# Patient Record
Sex: Male | Born: 1950
Health system: Southern US, Community
[De-identification: ages and names within clinical notes are randomized; demographics above are authoritative.]

## PROBLEM LIST (undated history)

## (undated) DIAGNOSIS — Z8249 Family history of ischemic heart disease and other diseases of the circulatory system: Secondary | ICD-10-CM

## (undated) DIAGNOSIS — I10 Essential (primary) hypertension: Secondary | ICD-10-CM

## (undated) DIAGNOSIS — I209 Angina pectoris, unspecified: Secondary | ICD-10-CM

## (undated) DIAGNOSIS — K219 Gastro-esophageal reflux disease without esophagitis: Secondary | ICD-10-CM

## (undated) DIAGNOSIS — H179 Unspecified corneal scar and opacity: Secondary | ICD-10-CM

## (undated) DIAGNOSIS — E785 Hyperlipidemia, unspecified: Secondary | ICD-10-CM

## (undated) DIAGNOSIS — Z87442 Personal history of urinary calculi: Secondary | ICD-10-CM

## (undated) DIAGNOSIS — I639 Cerebral infarction, unspecified: Secondary | ICD-10-CM

## (undated) DIAGNOSIS — I2510 Atherosclerotic heart disease of native coronary artery without angina pectoris: Secondary | ICD-10-CM

## (undated) DIAGNOSIS — Z8719 Personal history of other diseases of the digestive system: Secondary | ICD-10-CM

## (undated) HISTORY — DX: Gastro-esophageal reflux disease without esophagitis: K21.9

## (undated) HISTORY — PX: CATARACT EXTRACTION: SUR2

## (undated) HISTORY — DX: Family history of ischemic heart disease and other diseases of the circulatory system: Z82.49

## (undated) HISTORY — PX: OTHER SURGICAL HISTORY: SHX169

## (undated) HISTORY — DX: Essential (primary) hypertension: I10

## (undated) HISTORY — DX: Hyperlipidemia, unspecified: E78.5

## (undated) HISTORY — DX: Unspecified corneal scar and opacity: H17.9

## (undated) HISTORY — DX: Cerebral infarction, unspecified: I63.9

---

## 2002-08-17 HISTORY — PX: COLONOSCOPY: SHX174

## 2006-08-18 ENCOUNTER — Ambulatory Visit: Payer: Self-pay | Admitting: Family Medicine

## 2006-08-30 ENCOUNTER — Ambulatory Visit: Payer: Self-pay | Admitting: Family Medicine

## 2007-09-22 ENCOUNTER — Ambulatory Visit: Payer: Self-pay | Admitting: Family Medicine

## 2008-03-02 ENCOUNTER — Ambulatory Visit: Payer: Self-pay | Admitting: Family Medicine

## 2008-09-26 ENCOUNTER — Ambulatory Visit: Payer: Self-pay | Admitting: Family Medicine

## 2008-10-16 ENCOUNTER — Ambulatory Visit: Payer: Self-pay | Admitting: Gastroenterology

## 2008-10-17 LAB — HM COLONOSCOPY: HM Colonoscopy: NORMAL

## 2008-10-24 ENCOUNTER — Ambulatory Visit: Payer: Self-pay | Admitting: Gastroenterology

## 2009-01-11 ENCOUNTER — Emergency Department (HOSPITAL_COMMUNITY): Admission: EM | Admit: 2009-01-11 | Discharge: 2009-01-11 | Payer: Self-pay | Admitting: Emergency Medicine

## 2010-08-29 ENCOUNTER — Ambulatory Visit: Admit: 2010-08-29 | Payer: Self-pay | Admitting: Family Medicine

## 2011-11-16 ENCOUNTER — Ambulatory Visit (INDEPENDENT_AMBULATORY_CARE_PROVIDER_SITE_OTHER): Payer: No Typology Code available for payment source | Admitting: Family Medicine

## 2011-11-16 ENCOUNTER — Encounter: Payer: Self-pay | Admitting: Family Medicine

## 2011-11-16 VITALS — BP 140/90 | HR 70 | Ht 67.0 in | Wt 194.0 lb

## 2011-11-16 DIAGNOSIS — Z2911 Encounter for prophylactic immunotherapy for respiratory syncytial virus (RSV): Secondary | ICD-10-CM

## 2011-11-16 DIAGNOSIS — I10 Essential (primary) hypertension: Secondary | ICD-10-CM

## 2011-11-16 DIAGNOSIS — Z0000 Encounter for general adult medical examination without abnormal findings: Secondary | ICD-10-CM

## 2011-11-16 LAB — HEMOCCULT GUIAC POC 1CARD (OFFICE)

## 2011-11-16 MED ORDER — HYDROCHLOROTHIAZIDE 12.5 MG PO CAPS: 12.5000 mg | ORAL_CAPSULE | Freq: Every day | ORAL | Status: DC

## 2011-11-16 NOTE — Progress Notes (Signed)
  Subjective:    Patient ID: Jose Dyer, male    DOB: 12-14-1950, 60 y.o.   MRN: [id number]  HPI  he is here for complete examination. He has enjoyed excellent health. Presently he is just on HCTZ and doing quite well on this. He keeps himself very active physically, playing golf regularly. He has no other concerns or complaints.  Review of Systems  Constitutional: Negative.   HENT: Negative.   Eyes: Negative.   Respiratory: Negative.   Cardiovascular: Negative.   Gastrointestinal: Negative.   Genitourinary: Negative.   Musculoskeletal: Negative.   Skin: Negative.   Neurological: Negative.   Hematological: Negative.   Psychiatric/Behavioral: Negative.        Objective:   Physical Exam BP 140/90  Pulse 70  Ht 5\' 7"  (1.702 m)  Wt 194 lb (87.998 kg)  BMI 30.38 kg/m2  General Appearance:    Alert, cooperative, no distress, appears stated age  Head:    Normocephalic, without obvious abnormality, atraumatic  Eyes:    PERRL, conjunctiva/corneas clear, EOM's intact, fundi    benign  Ears:    Normal TM's and external ear canals  Nose:   Nares normal, mucosa normal, no drainage or sinus   tenderness  Throat:   Lips, mucosa, and tongue normal; teeth and gums normal  Neck:   Supple, no lymphadenopathy;  thyroid:  no   enlargement/tenderness/nodules; no carotid   bruit or JVD  Back:    Spine nontender, no curvature, ROM normal, no CVA     tenderness  Lungs:     Clear to auscultation bilaterally without wheezes, rales or     ronchi; respirations unlabored  Chest Wall:    No tenderness or deformity   Heart:    Regular rate and rhythm, S1 and S2 normal, no murmur, rub   or gallop  Breast Exam:    No chest wall tenderness, masses or gynecomastia  Abdomen:     Soft, non-tender, nondistended, normoactive bowel sounds,    no masses, no hepatosplenomegaly  Genitalia:   deferred   Rectal:    Normal sphincter tone, no masses or tenderness; guaiac negative stool.  Prostate smooth, no  nodules, not enlarged.  Extremities:   No clubbing, cyanosis or edema  Pulses:   2+ and symmetric all extremities  Skin:   Skin color, texture, turgor normal, no rashes or lesions  Lymph nodes:   Cervical, supraclavicular, and axillary nodes normal  Neurologic:   CNII-XII intact, normal strength, sensation and gait; reflexes 2+ and symmetric throughout          Psych:   Normal mood, affect, hygiene and grooming.           Assessment & Plan:   1. Routine general medical examination at a health care facility   2. Hypertension

## 2011-11-18 ENCOUNTER — Telehealth: Payer: Self-pay | Admitting: Family Medicine

## 2011-11-18 MED ORDER — ATORVASTATIN CALCIUM 20 MG PO TABS: 20.0000 mg | ORAL_TABLET | Freq: Every day | ORAL | Status: DC

## 2011-11-18 NOTE — Telephone Encounter (Signed)
Less than a lesser extent to Lipitor and have him return in 2 months for lipid panel

## 2011-11-18 NOTE — Telephone Encounter (Signed)
Pt.notified

## 2011-11-18 NOTE — Telephone Encounter (Signed)
Patient will be switched to Lipitor

## 2011-11-27 ENCOUNTER — Encounter: Payer: Self-pay | Admitting: *Deleted

## 2012-08-17 DIAGNOSIS — I639 Cerebral infarction, unspecified: Secondary | ICD-10-CM

## 2012-08-17 HISTORY — DX: Cerebral infarction, unspecified: I63.9

## 2012-11-21 ENCOUNTER — Encounter: Payer: Self-pay | Admitting: Internal Medicine

## 2012-11-25 ENCOUNTER — Encounter: Payer: PRIVATE HEALTH INSURANCE | Admitting: Family Medicine

## 2012-11-29 ENCOUNTER — Other Ambulatory Visit: Payer: Self-pay | Admitting: Family Medicine

## 2012-12-15 ENCOUNTER — Ambulatory Visit (INDEPENDENT_AMBULATORY_CARE_PROVIDER_SITE_OTHER): Payer: PRIVATE HEALTH INSURANCE | Admitting: Family Medicine

## 2012-12-15 ENCOUNTER — Encounter: Payer: Self-pay | Admitting: Family Medicine

## 2012-12-15 VITALS — Ht 68.0 in | Wt 200.0 lb

## 2012-12-15 DIAGNOSIS — Z0000 Encounter for general adult medical examination without abnormal findings: Secondary | ICD-10-CM

## 2012-12-15 DIAGNOSIS — I10 Essential (primary) hypertension: Secondary | ICD-10-CM | POA: Insufficient documentation

## 2012-12-15 LAB — POCT URINALYSIS DIPSTICK
Blood, UA: NEGATIVE
Nitrite, UA: NEGATIVE
Protein, UA: NEGATIVE
Spec Grav, UA: 1.015
Urobilinogen, UA: NEGATIVE

## 2012-12-15 MED ORDER — HYDROCHLOROTHIAZIDE 12.5 MG PO CAPS: ORAL_CAPSULE | ORAL | Status: DC

## 2012-12-15 NOTE — Progress Notes (Signed)
  Subjective:    Patient ID: Jose Dyer, male    DOB: November 03, 1950, 62 y.o.   MRN: [id number]  HPI He is here for complete examination. He is retired and keeps himself busy with cough. He continues on his blood pressure medications without difficulty. He has no particular concerns or complaints. He does take Aleve occasionally for aches and pains.   Review of Systems  Constitutional: Negative.   HENT: Negative.   Eyes: Negative.   Respiratory: Negative.   Cardiovascular: Negative.   Gastrointestinal: Negative.   Endocrine: Negative.   Genitourinary: Negative.   Allergic/Immunologic: Negative.   Neurological: Negative.   Hematological: Negative.   Psychiatric/Behavioral: Negative.        Objective:   Physical Exam Ht 5\' 8"  (1.727 m)  Wt 200 lb (90.719 kg)  BMI 30.42 kg/m2  General Appearance:    Alert, cooperative, no distress, appears stated age  Head:    Normocephalic, without obvious abnormality, atraumatic  Eyes:    PERRL, conjunctiva/corneas clear, EOM's intact, fundi    benign  Ears:    Normal TM's and external ear canals  Nose:   Nares normal, mucosa normal, no drainage or sinus   tenderness  Throat:   Lips, mucosa, and tongue normal; teeth and gums normal  Neck:   Supple, no lymphadenopathy;  thyroid:  no   enlargement/tenderness/nodules; no carotid   bruit or JVD  Back:    Spine nontender, no curvature, ROM normal, no CVA     tenderness  Lungs:     Clear to auscultation bilaterally without wheezes, rales or     ronchi; respirations unlabored  Chest Wall:    No tenderness or deformity   Heart:    Regular rate and rhythm, S1 and S2 normal, no murmur, rub   or gallop  Breast Exam:    No chest wall tenderness, masses or gynecomastia  Abdomen:     Soft, non-tender, nondistended, normoactive bowel sounds,    no masses, no hepatosplenomegaly  Genitalia:  deferred  Rectal:  deferred  Extremities:   No clubbing, cyanosis or edema  Pulses:   2+ and symmetric all  extremities  Skin:   Skin color, texture, turgor normal, no rashes or lesions  Lymph nodes:   Cervical, supraclavicular, and axillary nodes normal  Neurologic:   CNII-XII intact, normal strength, sensation and gait; reflexes 2+ and symmetric throughout          Psych:   Normal mood, affect, hygiene and grooming.           Assessment & Plan:  Routine general medical examination at a health care facility - Plan: POCT urinalysis dipstick  Hypertension - Plan: hydrochlorothiazide (MICROZIDE) 12.5 MG capsule  I encouraged him to continue to take good care of himself.

## 2012-12-22 ENCOUNTER — Encounter: Payer: Self-pay | Admitting: Family Medicine

## 2013-06-10 ENCOUNTER — Emergency Department (HOSPITAL_COMMUNITY)
Admission: EM | Admit: 2013-06-10 | Discharge: 2013-06-10 | Disposition: A | Discharge status: Home / Self Care | Payer: BC Managed Care – PPO | Source: Home / Self Care | Attending: Emergency Medicine | Admitting: Emergency Medicine

## 2013-06-10 ENCOUNTER — Encounter (HOSPITAL_COMMUNITY): Payer: Self-pay | Admitting: Emergency Medicine

## 2013-06-10 DIAGNOSIS — G510 Bell's palsy: Secondary | ICD-10-CM

## 2013-06-10 MED ORDER — POLYETHYL GLYCOL-PROPYL GLYCOL 0.4-0.3 % OP GEL: OPHTHALMIC | Status: DC

## 2013-06-10 MED ORDER — VALACYCLOVIR HCL 1 G PO TABS: 1000.0000 mg | ORAL_TABLET | Freq: Three times a day (TID) | ORAL | Status: AC

## 2013-06-10 MED ORDER — POLYETHYL GLYCOL-PROPYL GLYCOL 0.4-0.3 % OP SOLN: OPHTHALMIC | Status: DC

## 2013-06-10 MED ORDER — PREDNISONE 10 MG PO TABS: ORAL_TABLET | ORAL | Status: DC

## 2013-06-10 NOTE — ED Provider Notes (Signed)
Chief Complaint:   Chief Complaint  Patient presents with  . Facial Droop    History of Present Illness:   Jeshua Balfour is a 62 year old male who has had a history since yesterday of numbness of the right side of the face and then he developed weakness in the left side of the face. He did not have any pain and he decided the face, the ears, the neck, the head and he found a little bit difficult to speak. His eyes were itching and hurting but denies any diplopia or blurred vision. He denies any dryness of the eyes, change in his taste, or dryness of the mouth. He's had no change in his hearing. He denies any neck pain or stiffness and he's had no other neurological symptoms including numbness or tingling of the arms or legs, weakness of the arms or legs, difficulty with ambulation, coordination, or balance. He denies any head injury or recent respiratory or viral infections.  Review of Systems:  Other than noted above, the patient denies any of the following symptoms: Systemic:  No fever, chills, fatigue, photophobia, stiff neck. Eye:  No redness, eye pain, discharge, blurred vision, or diplopia. ENT:  No nasal congestion, rhinorrhea, sinus pressure or pain, sneezing, earache, or sore throat.  No jaw claudication. Neuro:  No paresthesias, loss of consciousness, seizure activity, muscle weakness, trouble with coordination or gait, trouble speaking or swallowing. Psych:  No depression, anxiety or trouble sleeping.  PMFSH:  Past medical history, family history, social history, meds, and allergies were reviewed.  He has high blood pressure and takes hydrochlorothiazide.  Physical Exam:   Vital signs:  BP 169/98  Pulse 86  Temp(Src) 98.2 F (36.8 C) (Oral)  Resp 16  SpO2 98% General:  Alert and oriented.  In no distress. Eye:  Lids and conjunctivas normal.  PERRL,  Full EOMs.  Fundi benign with normal discs and vessels. 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 He has a couple of opacities in his cornea. He states he has had herpes simplex in the eye previously. There is no eye redness or pain. ENT:  No cranial or facial tenderness to palpation.  TMs and canals clear.  Nasal mucosa was normal and uncongested without any drainage. No intra oral lesions, pharynx clear, mucous membranes moist, dentition normal. Neck:  Supple, full ROM, no tenderness to palpation.  No adenopathy or mass. No carotid bruit. Lungs: Clear to auscultation. Heart: Regular rhythm, no gallop or murmur. Neuro:  Alert and orented times 3.  Speech was clear, fluent, and appropriate.  Cranial nerve exam reveals a moderate left peripheral nerve palsy with weakness of eyebrow elevation, inability to completely close his left eye, and asymmetry of mouth. No pronator drift, muscle strength normal. Finger to nose normal.  DTRs were 2+ and symmetrical.Station and gait were normal.  Romberg's sign was normal.  He had some difficulty performing tandem gait. Sensation to light touch was intact bilaterally. Psych:  Normal affect.  Assessment:  The encounter diagnosis was Bell's palsy.  No evidence of stroke. 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  Plan:   1.  Meds:  The following meds were prescribed:   Discharge Medication List as of 06/10/2013  2:35 PM    START taking these medications   Details  Polyethyl Glycol-Propyl Glycol (SYSTANE) 0.4-0.3 % GEL  Apply to left eye at bedtime, Normal    Polyethyl Glycol-Propyl Glycol (SYSTANE) 0.4-0.3 % SOLN 1 drop in left eye every 2 hours while awake, Normal    predniSONE (DELTASONE) 10 MG tablet Take 4 tabs daily for 4 days, 3 tabs daily for 4 days, 2 tabs daily for 4 days, then 1 tab daily for 4 days., Normal    valACYclovir (VALTREX) 1000 MG tablet Take 1 tablet (1,000 mg total) by mouth 3 (three) times daily., Starting 06/10/2013, Last  dose on Sat 06/24/13, Normal        2.  Patient Education/Counseling:  The patient was given appropriate handouts, self care instructions, and instructed in symptomatic relief.   3.  Follow up:  The patient was told to follow up if no better in 3 to 4 days, if becoming worse in any way, and given some red flag symptoms such as new or changing neurological symptoms which would prompt immediate return.  Follow up with his primary care physician next week.      Reuben Likes, MD 06/10/13 2225

## 2013-06-10 NOTE — ED Notes (Signed)
Pt c/o facial numbness and droopy face onset yest Spoke w/a friend who is a Dr told him to wait 24 hrs to see how it progresses Reports he was feeling better but this am he woke up and was not able to chew his food Sxs include asymmetrical smile and unable to blink in the left eye, and slurry speach Denies: weakness, HA He is alert w/no signs of acute distress.

## 2013-12-21 ENCOUNTER — Ambulatory Visit (INDEPENDENT_AMBULATORY_CARE_PROVIDER_SITE_OTHER): Payer: Medicare Other | Admitting: Family Medicine

## 2013-12-21 ENCOUNTER — Encounter: Payer: Self-pay | Admitting: Family Medicine

## 2013-12-21 VITALS — BP 140/78 | HR 60 | Ht 69.0 in | Wt 203.0 lb

## 2013-12-21 DIAGNOSIS — Z79899 Other long term (current) drug therapy: Secondary | ICD-10-CM

## 2013-12-21 DIAGNOSIS — N529 Male erectile dysfunction, unspecified: Secondary | ICD-10-CM

## 2013-12-21 DIAGNOSIS — Z0000 Encounter for general adult medical examination without abnormal findings: Secondary | ICD-10-CM

## 2013-12-21 DIAGNOSIS — E785 Hyperlipidemia, unspecified: Secondary | ICD-10-CM

## 2013-12-21 DIAGNOSIS — I10 Essential (primary) hypertension: Secondary | ICD-10-CM

## 2013-12-21 LAB — LIPID PANEL
CHOLESTEROL: 214 mg/dL — AB (ref 0–200)
HDL: 42 mg/dL (ref >39)
LDL Cholesterol: 146 mg/dL — ABNORMAL HIGH (ref 0–99)
Total CHOL/HDL Ratio: 5.1 Ratio
Triglycerides: 130 mg/dL (ref <150)
VLDL: 26 mg/dL (ref 0–40)

## 2013-12-21 LAB — COMPREHENSIVE METABOLIC PANEL
ALBUMIN: 4.5 g/dL (ref 3.5–5.2)
ALT: 25 U/L (ref 0–53)
AST: 29 U/L (ref 0–37)
Alkaline Phosphatase: 65 U/L (ref 39–117)
BILIRUBIN TOTAL: 0.7 mg/dL (ref 0.2–1.2)
BUN: 25 mg/dL — ABNORMAL HIGH (ref 6–23)
CHLORIDE: 101 meq/L (ref 96–112)
CO2: 27 meq/L (ref 19–32)
Calcium: 9.2 mg/dL (ref 8.4–10.5)
Creat: 0.89 mg/dL (ref 0.50–1.35)
GLUCOSE: 102 mg/dL — AB (ref 70–99)
POTASSIUM: 4.4 meq/L (ref 3.5–5.3)
SODIUM: 136 meq/L (ref 135–145)
TOTAL PROTEIN: 7.2 g/dL (ref 6.0–8.3)

## 2013-12-21 MED ORDER — AVANAFIL 100 MG PO TABS: 1.0000 | ORAL_TABLET | Freq: Once | ORAL | Status: DC

## 2013-12-21 MED ORDER — HYDROCHLOROTHIAZIDE 12.5 MG PO CAPS: ORAL_CAPSULE | ORAL | Status: DC

## 2013-12-21 NOTE — Progress Notes (Signed)
   Subjective:    Patient ID: Jose Dyer, male    DOB: Jul 05, 1951, 63 y.o.   MRN: [id number]  HPI He is here for his initial IPPE.   Review of Systems     Objective:   Physical Exam        Assessment & Plan:  ED (erectile dysfunction) - Plan: Avanafil (STENDRA) 100 MG TABS, CBC with Differential, Comprehensive metabolic panel, Lipid panel  Hypertension - Plan: hydrochlorothiazide (MICROZIDE) 12.5 MG capsule, CBC with Differential, Comprehensive metabolic panel  Other and unspecified hyperlipidemia - Plan: Lipid panel  Encounter for long-term (current) use of other medications

## 2013-12-22 LAB — CBC WITH DIFFERENTIAL/PLATELET
Basophils Absolute: 0 10*3/uL (ref 0.0–0.1)
Basophils Relative: 0 % (ref 0–1)
EOS ABS: 0.1 10*3/uL (ref 0.0–0.7)
Eosinophils Relative: 1 % (ref 0–5)
HCT: 42.1 % (ref 39.0–52.0)
HEMOGLOBIN: 14.7 g/dL (ref 13.0–17.0)
LYMPHS ABS: 2.8 10*3/uL (ref 0.7–4.0)
LYMPHS PCT: 35 % (ref 12–46)
MCH: 29.2 pg (ref 26.0–34.0)
MCHC: 34.9 g/dL (ref 30.0–36.0)
MCV: 83.7 fL (ref 78.0–100.0)
MONOS PCT: 8 % (ref 3–12)
Monocytes Absolute: 0.6 10*3/uL (ref 0.1–1.0)
NEUTROS ABS: 4.5 10*3/uL (ref 1.7–7.7)
NEUTROS PCT: 56 % (ref 43–77)
PLATELETS: 215 10*3/uL (ref 150–400)
RBC: 5.03 MIL/uL (ref 4.22–5.81)
RDW: 14.6 % (ref 11.5–15.5)
WBC: 8 10*3/uL (ref 4.0–10.5)

## 2014-01-16 ENCOUNTER — Encounter: Payer: Self-pay | Admitting: Family Medicine

## 2014-01-16 ENCOUNTER — Ambulatory Visit (INDEPENDENT_AMBULATORY_CARE_PROVIDER_SITE_OTHER): Payer: Medicare Other | Admitting: Family Medicine

## 2014-01-16 VITALS — BP 130/90 | HR 60 | Wt 205.0 lb

## 2014-01-16 DIAGNOSIS — G459 Transient cerebral ischemic attack, unspecified: Secondary | ICD-10-CM

## 2014-01-16 NOTE — Progress Notes (Signed)
   Subjective:    Patient ID: Jose Dyer, male    DOB: 02-25-1951, 63 y.o.   MRN: [id number]  HPI On May 7 he had the sudden onset of dizziness while playing golf but no blurred vision, double vision, weakness voice changes. The symptoms lasted about a minute. Her meds later he had another episode of dizziness. The second episode lasted 15 minutes. While driving home he had the onset of right arm and leg numbness as well as weakness. He also noted voice changes. He had another episode of dizziness as well as right arm weakness and difficulty speaking and lasted approximately 15-20 minutes. The symptoms lasted intermittently for 5 hours. He did not seek medical care. Has not had any more episodes since that time.   Review of Systems     Objective:   Physical Exam alert and in no distress. Normal speech pattern noted. EOMI. Other cranial nerves intact. Cerebellar testing negative. DTRs normal. Normal finger to nose with no pronator drift. No carotid bruits noted. Tympanic membranes and canals are normal. Throat is clear. Tonsils are normal. Neck is supple without adenopathy or thyromegaly. Cardiac exam shows a regular sinus rhythm without murmurs or gallops. Lungs are clear to auscultation.        Assessment & Plan:  TIA (transient ischemic attack) - Plan: US Carotid Duplex Bilateral, MR Brain Wo Contrast  case was discussed with radiology. I will start with an MRI and carotid Dopplers. Might need to do transesophageal echo and refer to neurology depending upon my results. He also need to consider MR angiogram

## 2014-01-22 ENCOUNTER — Telehealth: Payer: Self-pay

## 2014-01-22 NOTE — Telephone Encounter (Signed)
Authoritzation approval number for MRI brian w/o contrast for [facility] Auth# [id number] valid from 01/22/14-02/20/14. MRI scheduled for 01/23/14

## 2014-01-23 ENCOUNTER — Other Ambulatory Visit: Payer: BC Managed Care – PPO

## 2014-01-24 ENCOUNTER — Ambulatory Visit
Admission: RE | Admit: 2014-01-24 | Discharge: 2014-01-24 | Disposition: A | Discharge status: Home / Self Care | Payer: Medicare Other | Source: Ambulatory Visit | Attending: Family Medicine | Admitting: Family Medicine

## 2014-01-24 ENCOUNTER — Encounter: Payer: Self-pay | Admitting: Family Medicine

## 2014-01-24 DIAGNOSIS — G459 Transient cerebral ischemic attack, unspecified: Secondary | ICD-10-CM

## 2014-01-25 NOTE — Addendum Note (Signed)
Addended by: Clyde Lundborg A on: 01/25/2014 08:59 AM   Modules accepted: Orders

## 2014-01-30 ENCOUNTER — Encounter: Payer: Self-pay | Admitting: Neurology

## 2014-01-30 ENCOUNTER — Ambulatory Visit (INDEPENDENT_AMBULATORY_CARE_PROVIDER_SITE_OTHER): Payer: Medicare Other | Admitting: Neurology

## 2014-01-30 VITALS — BP 140/80 | HR 61 | Ht 67.72 in | Wt 200.3 lb

## 2014-01-30 DIAGNOSIS — I639 Cerebral infarction, unspecified: Secondary | ICD-10-CM | POA: Insufficient documentation

## 2014-01-30 DIAGNOSIS — I10 Essential (primary) hypertension: Secondary | ICD-10-CM

## 2014-01-30 DIAGNOSIS — G459 Transient cerebral ischemic attack, unspecified: Secondary | ICD-10-CM

## 2014-01-30 DIAGNOSIS — E785 Hyperlipidemia, unspecified: Secondary | ICD-10-CM

## 2014-01-30 LAB — HEMOGLOBIN A1C
Hgb A1c MFr Bld: 5.5 % (ref <5.7)
Mean Plasma Glucose: 111 mg/dL (ref <117)

## 2014-01-30 MED ORDER — FENOFIBRATE 54 MG PO TABS: 54.0000 mg | ORAL_TABLET | Freq: Every day | ORAL | Status: DC

## 2014-01-30 MED ORDER — CLOPIDOGREL BISULFATE 75 MG PO TABS: 75.0000 mg | ORAL_TABLET | Freq: Every day | ORAL | Status: DC

## 2014-01-30 NOTE — Patient Instructions (Addendum)
1. MRA head without contrast 2. Bloodwork for HbA1c 3. Proceed with echocardiogram as planned by PCP 4. Start Plavix 75mg : take once a day 5. Stop aspirin 6. Start Tricor (fenofibrate) 54mg : take once a day 7. If symptoms recur, go to ER immediately

## 2014-01-30 NOTE — Progress Notes (Signed)
NEUROLOGY CONSULTATION NOTE  Jose Dyer MRN: [id number] DOB: [birth year]  Referring Nyomi Howser: Dr. Jill Alexanders Primary care Dam Ashraf: Dr. Jill Alexanders  Reason for consult: TIA  Dear Dr Redmond School:  Thank you for your kind referral of Jose Dyer for consultation of the above symptoms. Although his history is well known to you, please allow me to reiterate it for the purpose of our medical record. Records and images were personally reviewed where available.  HISTORY OF PRESENT ILLNESS: This is a very pleasant 63 year old right-handed man with vascular risk factors including hypertension and hyperlipidemia, in his usual state of health until 12/21/2013 while playing golf at around 1pm, when he suddenly felt like he was going to fall.  This lasted 10 seconds then resolved with no associated focal symptoms or headache.  He walked to the next hole, then suddenly again felt lightheaded and wobbly, kept walking for another 20 minutes but still felt dizzy.  He sat down and had a cold drink, felt better and started driving home, when he suddenly had right arm and leg numbness and mild weakness, face was unaffected. He tried to reach for his cellphone with his right hand but felt weak. He tried to say something but could not say any words.  He was able to pull over and stop on the side of the road until symptoms resolved within 2-3 minutes.  He got home then again had the same right arm and leg numbness and weakness, sensation of lightheadedness and feeling wobbly, lasting 3-4 minutes.  This then recurred and he tried speaking but could not say anything, lasting 5 minutes.  He reports having 2-3 similar episodes every hour until 5:30pm, with at least 10-12 episodes total.  After 5:30pm, there were no further similar symptoms and he has been feeling well over the past month.  He denied any associated headache, confusion, left-sided symptoms, diplopia, dysarthria, dysphagia.  He finally saw his PCP a month  later and has had an MRI brain without contrast which I personally reviewed.  There were no acute changes, no abnormal restricted diffusion.  There is note of asymmetric T2 hyperintensity in the left caudate head and anterior left lentiform nucleus. Dilated perivascular spaces are more prominent on the left as well.  Asymmetry was noted to be possibly related to remote infarct or asymmetric ischemia.  Carotid dopplers unremarkable  Lipid panel shows elevated LDL 146, total cholesterol 214.  He reports his cholesterol levels have always been like this despite good diet and exercise, he had muscle weakness on Zocor and Lipitor in the past.  He has been taking a baby aspirin daily for at least 10 years, and increased this to twice a day for the past month.  He has been taking HCTZ for hypertension for the past 5-6 years, with SBP usually in the 130s.  He has a history of left Bell's palsy last October 2014 that resolved within 2 weeks, treated with Prednisone and antiviral.  He denies any prior similar symptoms in the past.  He is very active and plays golf daily, uses the treadmill and lifts weights.  There is no family history of stroke.  He has occasional neck and back pain, no bowel/bladder dysfunction.  He denies any chest pain, shortness of breath, or palpitations.  Laboratory Data: Lab Results  Component Value Date   WBC 8.0 12/21/2013   HGB 14.7 12/21/2013   HCT 42.1 12/21/2013   MCV 83.7 12/21/2013   PLT 215 12/21/2013  Chemistry      Component Value Date/Time   NA 136 12/21/2013 1039   K 4.4 12/21/2013 1039   CL 101 12/21/2013 1039   CO2 27 12/21/2013 1039   BUN 25* 12/21/2013 1039   CREATININE 0.89 12/21/2013 1039      Component Value Date/Time   CALCIUM 9.2 12/21/2013 1039   ALKPHOS 65 12/21/2013 1039   AST 29 12/21/2013 1039   ALT 25 12/21/2013 1039   BILITOT 0.7 12/21/2013 1039     Lab Results  Component Value Date   CHOL 214* 12/21/2013   HDL 42 12/21/2013   LDLCALC 146* 12/21/2013   TRIG 130 12/21/2013     CHOLHDL 5.1 12/21/2013     PAST MEDICAL HISTORY: Past Medical History  Diagnosis Date  . Hypertension   . FHx: cardiovascular disease   . FHx: colon cancer   . Corneal scarring   . Dyslipidemia     PAST SURGICAL HISTORY: Past Surgical History  Procedure Laterality Date  . Colonoscopy  2004    MEDICATIONS: Current Outpatient Prescriptions on File Prior to Visit  Medication Sig Dispense Refill  . hydrochlorothiazide (MICROZIDE) 12.5 MG capsule TAKE ONE CAPSULE BY MOUTH EVERY DAY  90 capsule  3   No current facility-administered medications on file prior to visit.    ALLERGIES: No Known Allergies  FAMILY HISTORY: History reviewed. No pertinent family history.  SOCIAL HISTORY: History   Social History  . Marital Status: Married    Spouse Name: N/A    Number of Children: N/A  . Years of Education: N/A   Occupational History  . Not on file.   Social History Main Topics  . Smoking status: Never Smoker   . Smokeless tobacco: Never Used  . Alcohol Use: No  . Drug Use: No  . Sexual Activity: Yes   Other Topics Concern  . Not on file   Social History Narrative  . No narrative on file    REVIEW OF SYSTEMS: Constitutional: No fevers, chills, or sweats, no generalized fatigue, change in appetite Eyes: No visual changes, double vision, eye pain Ear, nose and throat: No hearing loss, ear pain, nasal congestion, sore throat Cardiovascular: No chest pain, palpitations Respiratory:  No shortness of breath at rest or with exertion, wheezes GastrointestinaI: No nausea, vomiting, diarrhea, abdominal pain, fecal incontinence Genitourinary:  No dysuria, urinary retention or frequency Musculoskeletal:  occl neck pain, back pain Integumentary: No rash, pruritus, skin lesions Neurological: as above Psychiatric: No depression, insomnia, anxiety Endocrine: No palpitations, fatigue, diaphoresis, mood swings, change in appetite, change in weight, increased  thirst Hematologic/Lymphatic:  No anemia, purpura, petechiae. Allergic/Immunologic: no itchy/runny eyes, nasal congestion, recent allergic reactions, rashes  PHYSICAL EXAM: Filed Vitals:   01/30/14 0803  BP: 140/80  Pulse: 61   General: No acute distress Head:  Normocephalic/atraumatic Eyes: Fundoscopic exam shows bilateral sharp discs, no vessel changes, exudates, or hemorrhages Neck: supple, no paraspinal tenderness, full range of motion Back: No paraspinal tenderness Heart: regular rate and rhythm Lungs: Clear to auscultation bilaterally. Vascular: No carotid bruits. Skin/Extremities: No rash, no edema Neurological Exam: Mental status: alert and oriented to person, place, and time, no dysarthria or aphasia, Fund of knowledge is appropriate.  Recent and remote memory are intact.  Attention and concentration are normal.    Able to name objects and repeat phrases. Cranial nerves: CN I: not tested CN II: pupils equal, round and reactive to light, visual fields intact, fundi unremarkable. CN III, IV, VI:  full  range of motion, no nystagmus, no ptosis CN V: facial sensation intact CN VII: upper and lower face symmetric CN VIII: hearing intact to finger rub CN IX, X: gag intact, uvula midline CN XI: sternocleidomastoid and trapezius muscles intact CN XII: tongue midline Bulk & Tone: normal, no fasciculations. Motor: 5/5 throughout with no pronator drift. Sensation: intact to light touch, cold, pin, vibration and joint position sense.  No extinction to double simultaneous stimulation.  Romberg test negative Deep Tendon Reflexes: +2 throughout, no ankle clonus Plantar responses: downgoing bilaterally Cerebellar: no incoordination on finger to nose, heel to shin. No dysdiadochokinesia Gait: narrow-based and steady, able to tandem walk adequately. Tremor: none  IMPRESSION: This is a very pleasant 63 year old right-handed man with a vascular risk factors including hypertension and  hyperlipidemia presenting with recurrent transient episodes of right-sided weakness and numbness that lasted 4 hours last 12/21/2013, concerning for stuttering TIA.  His MRI brain is unremarkable except for asymmetry in the basal ganglia on the left. Carotid dopplers unremarkable.  An MRA head without contrast will be ordered to assess for intracranial stenosis.  He is scheduled for an echocardiogram as part of TIA workup.  Check HbA1c.  He is asking about starting anticoagulation, we discussed that anticoagulation would only be indicated for stroke prevention if there is evidence of cardioembolic etiology (i.e. atrial fibrillation).  If symptoms are due to intracranial stenosis, maximum medical management with antiplatelet therapy and lipid control are recommended.  He will switch from aspirin to Plavix 75mg  daily.  He has been intolerant of statins in the past and will try fenofibrate. Side effects were discussed. Continue to monitor BP, with goal SBP 120-130.  We also discussed that if symptoms recur, he should go to ER immediately and he expressed understanding.  He will follow-up in 3-4 months.  Thank you for allowing me to participate in the care of this patient. Please do not hesitate to call for any questions or concerns.   Ellouise Newer, M.D.  CC: Dr. Jill Alexanders

## 2014-01-31 ENCOUNTER — Telehealth: Payer: Self-pay | Admitting: Neurology

## 2014-01-31 NOTE — Telephone Encounter (Signed)
Noted. If he can have it done earlier while in [facility], would do that. Otherwise, again go to ER if symptoms recur. Pls obtain records from [place]. Thanks

## 2014-01-31 NOTE — Telephone Encounter (Signed)
MRA head with out June 24 at [place] at [facility] with and arrival time of 4:45

## 2014-01-31 NOTE — Addendum Note (Signed)
Addended by: Ella Jubilee on: 01/31/2014 10:37 AM   Modules accepted: Orders

## 2014-01-31 NOTE — Telephone Encounter (Signed)
Patient called stating he was on his way driving to see his son on yesterday and after being in the car for over 3 hours he stared to feel tingling in his right arm and leg with foot and toe numbness. Patient states it does not feel like the past TIAs and he is unsure what is going on but knows he feels different. Patient has agree to go forward with the imaging you recommended at his office visit. Please advise

## 2014-01-31 NOTE — Telephone Encounter (Signed)
Pt called requesting to speak to a nurse regarding setting up a MRA and also Pt went to the ER in [place] at [facility]. Pt was experiencing numbness in right hand. He is experiencing other symptoms today as well.

## 2014-02-02 ENCOUNTER — Telehealth: Payer: Self-pay | Admitting: Neurology

## 2014-02-02 NOTE — Telephone Encounter (Signed)
Patient notified

## 2014-02-02 NOTE — Telephone Encounter (Signed)
Pt needs to talk to someone today he has a couple of questions please call [phone]

## 2014-02-07 ENCOUNTER — Ambulatory Visit (HOSPITAL_COMMUNITY)
Admission: RE | Admit: 2014-02-07 | Discharge: 2014-02-07 | Disposition: A | Discharge status: Home / Self Care | Payer: Medicare Other | Source: Ambulatory Visit | Attending: Neurology | Admitting: Neurology

## 2014-02-07 DIAGNOSIS — I10 Essential (primary) hypertension: Secondary | ICD-10-CM

## 2014-02-07 DIAGNOSIS — E785 Hyperlipidemia, unspecified: Secondary | ICD-10-CM

## 2014-02-07 DIAGNOSIS — G459 Transient cerebral ischemic attack, unspecified: Secondary | ICD-10-CM

## 2014-02-13 ENCOUNTER — Ambulatory Visit: Payer: Medicare Other | Admitting: Neurology

## 2014-05-23 ENCOUNTER — Other Ambulatory Visit (INDEPENDENT_AMBULATORY_CARE_PROVIDER_SITE_OTHER): Payer: Medicare Other

## 2014-05-23 DIAGNOSIS — Z23 Encounter for immunization: Secondary | ICD-10-CM

## 2014-06-21 ENCOUNTER — Encounter: Payer: Self-pay | Admitting: Family Medicine

## 2014-06-22 ENCOUNTER — Telehealth: Payer: Self-pay | Admitting: Family Medicine

## 2014-06-22 DIAGNOSIS — E785 Hyperlipidemia, unspecified: Secondary | ICD-10-CM

## 2014-06-22 DIAGNOSIS — G459 Transient cerebral ischemic attack, unspecified: Secondary | ICD-10-CM

## 2014-06-22 MED ORDER — CLOPIDOGREL BISULFATE 75 MG PO TABS: 75.0000 mg | ORAL_TABLET | Freq: Every day | ORAL | Status: DC

## 2014-06-22 MED ORDER — FENOFIBRATE 54 MG PO TABS: 54.0000 mg | ORAL_TABLET | Freq: Every day | ORAL | Status: DC

## 2014-06-22 NOTE — Telephone Encounter (Signed)
Received phone call from [facility]. Patient has just transferred to their pharmacy. He is requesting 90 day Rx's for medications Dr. Delice Lesch has prescribed for him which are the Plavix & fenofibrate. Patient is requesting 90 day Rx's at this time due to [place] going out of the country on next [place]. Will send in 90 day Rx's for these meds as Dr. Delice Lesch refilled these in June with 1 years refill.

## 2014-06-28 ENCOUNTER — Ambulatory Visit (INDEPENDENT_AMBULATORY_CARE_PROVIDER_SITE_OTHER): Payer: Medicare Other | Admitting: Family Medicine

## 2014-06-28 VITALS — BP 124/80 | HR 58 | Wt 180.0 lb

## 2014-06-28 DIAGNOSIS — I10 Essential (primary) hypertension: Secondary | ICD-10-CM

## 2014-06-28 DIAGNOSIS — Z889 Allergy status to unspecified drugs, medicaments and biological substances status: Secondary | ICD-10-CM

## 2014-06-28 DIAGNOSIS — E785 Hyperlipidemia, unspecified: Secondary | ICD-10-CM

## 2014-06-28 DIAGNOSIS — Z23 Encounter for immunization: Secondary | ICD-10-CM

## 2014-06-28 DIAGNOSIS — I639 Cerebral infarction, unspecified: Secondary | ICD-10-CM

## 2014-06-28 DIAGNOSIS — Z789 Other specified health status: Secondary | ICD-10-CM

## 2014-06-28 LAB — LIPID PANEL
CHOL/HDL RATIO: 3.3 ratio
Cholesterol: 159 mg/dL (ref 0–200)
HDL: 48 mg/dL (ref >39)
LDL Cholesterol: 102 mg/dL — ABNORMAL HIGH (ref 0–99)
Triglycerides: 44 mg/dL (ref <150)
VLDL: 9 mg/dL (ref 0–40)

## 2014-06-28 NOTE — Progress Notes (Signed)
   Subjective:    Patient ID: Jose Dyer, male    DOB: 1950-11-01, 63 y.o.   MRN: [id number]  HPI He is here for recheck. He does have a history of statin intolerance and now was on the fenofibrate. He is having no difficulty with this. He states that in June of this year while traveling to [place] he had another neurologic event and was seen in a hospital. An evaluation was done which did show a CVA. He was then seen in [facility] by apparently 2 neurologists. Presently he is having visual disturbance and has seen an ophthalmologist. He also is complained of bilateral foot pain and has seen an improvement in this. He now mainly notes difficulty on the right side. He states that he literally had to visually inspect his feet since he had anesthesia. Only on the plantar surface.   Review of Systems     Objective:   Physical Exam Alert and in no distress. OrientedX3. EOMI. Other cranial nerves grossly intact.Full motion of all extremities. Weakness is noted with right knee flexion. DTRs. Negative Babinski and clonus       Assessment & Plan:  Statin intolerance - Plan: Lipid panel  Need for prophylactic vaccination against Streptococcus pneumoniae (pneumococcus) - Plan: Pneumococcal conjugate vaccine 13-valent  Cerebral infarction due to unspecified mechanism  Essential hypertension  Hyperlipidemia LDL goal <100 he is to sign a release form to get the information from the hospital in [place]. I want to make sure that all the appropriate tests were done.

## 2014-12-06 ENCOUNTER — Ambulatory Visit (INDEPENDENT_AMBULATORY_CARE_PROVIDER_SITE_OTHER): Payer: Medicare Other | Admitting: Family Medicine

## 2014-12-06 ENCOUNTER — Encounter: Payer: Self-pay | Admitting: Family Medicine

## 2014-12-06 VITALS — BP 140/74 | HR 56 | Ht 68.0 in | Wt 169.4 lb

## 2014-12-06 DIAGNOSIS — Z125 Encounter for screening for malignant neoplasm of prostate: Secondary | ICD-10-CM | POA: Diagnosis not present

## 2014-12-06 DIAGNOSIS — Z0000 Encounter for general adult medical examination without abnormal findings: Secondary | ICD-10-CM | POA: Diagnosis not present

## 2014-12-06 DIAGNOSIS — I638 Other cerebral infarction: Secondary | ICD-10-CM

## 2014-12-06 DIAGNOSIS — I10 Essential (primary) hypertension: Secondary | ICD-10-CM | POA: Diagnosis not present

## 2014-12-06 DIAGNOSIS — Z789 Other specified health status: Secondary | ICD-10-CM

## 2014-12-06 DIAGNOSIS — E785 Hyperlipidemia, unspecified: Secondary | ICD-10-CM

## 2014-12-06 DIAGNOSIS — Z889 Allergy status to unspecified drugs, medicaments and biological substances status: Secondary | ICD-10-CM | POA: Diagnosis not present

## 2014-12-06 DIAGNOSIS — I6350 Cerebral infarction due to unspecified occlusion or stenosis of unspecified cerebral artery: Secondary | ICD-10-CM | POA: Diagnosis not present

## 2014-12-06 DIAGNOSIS — I6389 Other cerebral infarction: Secondary | ICD-10-CM

## 2014-12-06 LAB — POCT URINALYSIS DIPSTICK
Bilirubin, UA: NEGATIVE
Blood, UA: NEGATIVE
GLUCOSE UA: NEGATIVE
Ketones, UA: NEGATIVE
Leukocytes, UA: NEGATIVE
Nitrite, UA: NEGATIVE
Protein, UA: NEGATIVE
UROBILINOGEN UA: NEGATIVE
pH, UA: 6

## 2014-12-06 LAB — CBC WITH DIFFERENTIAL/PLATELET
BASOS ABS: 0.1 10*3/uL (ref 0.0–0.1)
BASOS PCT: 1 % (ref 0–1)
EOS PCT: 2 % (ref 0–5)
Eosinophils Absolute: 0.1 10*3/uL (ref 0.0–0.7)
HCT: 37.2 % — ABNORMAL LOW (ref 39.0–52.0)
Hemoglobin: 12.5 g/dL — ABNORMAL LOW (ref 13.0–17.0)
Lymphocytes Relative: 27 % (ref 12–46)
Lymphs Abs: 1.4 10*3/uL (ref 0.7–4.0)
MCH: 29.1 pg (ref 26.0–34.0)
MCHC: 33.6 g/dL (ref 30.0–36.0)
MCV: 86.5 fL (ref 78.0–100.0)
MONO ABS: 0.6 10*3/uL (ref 0.1–1.0)
MPV: 10.6 fL (ref 8.6–12.4)
Monocytes Relative: 11 % (ref 3–12)
Neutro Abs: 3 10*3/uL (ref 1.7–7.7)
Neutrophils Relative %: 59 % (ref 43–77)
PLATELETS: 196 10*3/uL (ref 150–400)
RBC: 4.3 MIL/uL (ref 4.22–5.81)
RDW: 13.5 % (ref 11.5–15.5)
WBC: 5.1 10*3/uL (ref 4.0–10.5)

## 2014-12-06 NOTE — Progress Notes (Signed)
   Subjective:    Patient ID: Jose Dyer, male    DOB: 11-22-1950, 64 y.o.   MRN: [id number]  HPI He is here for complete examination. He has a history of hypertension and is doing well on his present medication regimen. He has statin intolerance and now is using fenofibrate. He has a previous history of CVA and does have Plavix. He has no other concerns or complaints. He is retired but working at [employer] course. He keeps himself quite physically active. Life is going well. Family and social history as well as health maintenance was reviewed. He's had no chest pain, nausea, vomiting. He has made a concerted effort to lose weight and has lost over 30 pounds. He does follow-up regularly with his ophthalmologist.  Review of Systems  All other systems reviewed and are negative.      Objective:   Physical Exam Alert and in no distress. Fundi difficult to see but appeared benignTympanic membranes and canals are normal. Pharyngeal area is normal. Neck is supple without adenopathy or thyromegaly. Cardiac exam shows a regular sinus rhythm without murmurs or gallops. Lungs are clear to auscultation.abdominal exam shows no hepatosplenomegaly masses or tenderness.       Assessment & Plan:  Annual physical exam - Plan: Urinalysis Dipstick  Statin intolerance - Plan: Lipid panel  Essential hypertension - Plan: CBC with Differential/Platelet, Comprehensive metabolic panel  Hyperlipidemia LDL goal <100 - Plan: Lipid panel  Cerebral infarction due to other mechanism - Plan: CBC with Differential/Platelet, Comprehensive metabolic panel, Lipid panel  Special screening for malignant neoplasm of prostate - Plan: PSA, Medicare encouraged him to continue with his active lifestyle.

## 2014-12-07 LAB — COMPREHENSIVE METABOLIC PANEL
ALBUMIN: 3.8 g/dL (ref 3.5–5.2)
ALT: 14 U/L (ref 0–53)
AST: 22 U/L (ref 0–37)
Alkaline Phosphatase: 41 U/L (ref 39–117)
BILIRUBIN TOTAL: 0.5 mg/dL (ref 0.2–1.2)
BUN: 19 mg/dL (ref 6–23)
CO2: 26 meq/L (ref 19–32)
Calcium: 9.1 mg/dL (ref 8.4–10.5)
Chloride: 104 mEq/L (ref 96–112)
Creat: 0.86 mg/dL (ref 0.50–1.35)
GLUCOSE: 84 mg/dL (ref 70–99)
Potassium: 3.9 mEq/L (ref 3.5–5.3)
Sodium: 139 mEq/L (ref 135–145)
TOTAL PROTEIN: 6.4 g/dL (ref 6.0–8.3)

## 2014-12-07 LAB — PSA: PSA: 1.64 ng/mL (ref <=4.00)

## 2014-12-07 LAB — LIPID PANEL
CHOL/HDL RATIO: 3 ratio
Cholesterol: 126 mg/dL (ref 0–200)
HDL: 42 mg/dL (ref >=40)
LDL Cholesterol: 78 mg/dL (ref 0–99)
Triglycerides: 32 mg/dL (ref <150)
VLDL: 6 mg/dL (ref 0–40)

## 2014-12-12 ENCOUNTER — Encounter: Payer: Self-pay | Admitting: Family Medicine

## 2014-12-15 ENCOUNTER — Other Ambulatory Visit: Payer: Self-pay | Admitting: Family Medicine

## 2014-12-15 ENCOUNTER — Other Ambulatory Visit: Payer: Self-pay | Admitting: Neurology

## 2015-01-16 ENCOUNTER — Telehealth: Payer: Self-pay | Admitting: Family Medicine

## 2015-01-16 ENCOUNTER — Other Ambulatory Visit: Payer: Self-pay | Admitting: Neurology

## 2015-01-16 MED ORDER — CLOPIDOGREL BISULFATE 75 MG PO TABS: ORAL_TABLET | ORAL | Status: DC

## 2015-01-16 MED ORDER — FENOFIBRATE 54 MG PO TABS: 54.0000 mg | ORAL_TABLET | Freq: Every day | ORAL | Status: DC

## 2015-01-16 NOTE — Telephone Encounter (Signed)
Requesting refill on Plavix 75mg  and Fenofibrate 54mg . Pt says he discussed with Dr Redmond School about him taking over maintaining these two meds instead of pt going to Neurology doctor and pt says Dr Redmond School agreed to handle refills. Pharmacy sent refill request to Neurology doctor today instead of Dr Redmond School but pt has not seen neurologist in a while

## 2015-04-03 ENCOUNTER — Other Ambulatory Visit: Payer: Self-pay | Admitting: Family Medicine

## 2015-04-03 ENCOUNTER — Encounter: Payer: Self-pay | Admitting: Family Medicine

## 2015-04-03 MED ORDER — TADALAFIL 20 MG PO TABS: 20.0000 mg | ORAL_TABLET | Freq: Every day | ORAL | Status: DC | PRN

## 2015-04-05 ENCOUNTER — Other Ambulatory Visit: Payer: Self-pay

## 2015-04-05 MED ORDER — TADALAFIL 5 MG PO TABS: 5.0000 mg | ORAL_TABLET | Freq: Every day | ORAL | Status: DC | PRN

## 2015-04-10 ENCOUNTER — Encounter: Payer: Self-pay | Admitting: Family Medicine

## 2015-04-16 ENCOUNTER — Telehealth: Payer: Self-pay | Admitting: Family Medicine

## 2015-04-16 MED ORDER — TADALAFIL 5 MG PO TABS: 5.0000 mg | ORAL_TABLET | Freq: Every day | ORAL | Status: DC | PRN

## 2015-04-16 NOTE — Telephone Encounter (Signed)
[facility] called & stated they had faxed [place] twice to change Rx Cialis to 5mg  #30 for pt to be able to use his voucher & cost him a lot less.  We had changed strength to 5mg  but not quantity.  Discussed with Cheri and she Ok'd switch to 5mg  #30.  Pharmacist informed .

## 2015-06-12 ENCOUNTER — Other Ambulatory Visit: Payer: Self-pay | Admitting: Family Medicine

## 2015-10-11 DIAGNOSIS — M7661 Achilles tendinitis, right leg: Secondary | ICD-10-CM | POA: Diagnosis not present

## 2015-10-11 DIAGNOSIS — M25572 Pain in left ankle and joints of left foot: Secondary | ICD-10-CM | POA: Diagnosis not present

## 2015-10-11 DIAGNOSIS — M7742 Metatarsalgia, left foot: Secondary | ICD-10-CM | POA: Diagnosis not present

## 2015-10-11 DIAGNOSIS — M7662 Achilles tendinitis, left leg: Secondary | ICD-10-CM | POA: Diagnosis not present

## 2015-10-11 DIAGNOSIS — M722 Plantar fascial fibromatosis: Secondary | ICD-10-CM | POA: Diagnosis not present

## 2015-12-03 ENCOUNTER — Telehealth: Payer: Self-pay | Admitting: Family Medicine

## 2015-12-03 ENCOUNTER — Other Ambulatory Visit: Payer: Self-pay

## 2015-12-03 MED ORDER — CLOPIDOGREL BISULFATE 75 MG PO TABS: ORAL_TABLET | ORAL | Status: DC

## 2015-12-03 NOTE — Telephone Encounter (Signed)
[facility] req refill for Clopidogrel TAB.  [facility] is the Pharmacy pt wants for future refills.  Pt has cpe May 1.

## 2015-12-03 NOTE — Telephone Encounter (Signed)
Sent plavix in

## 2015-12-04 ENCOUNTER — Other Ambulatory Visit: Payer: Self-pay | Admitting: *Deleted

## 2015-12-04 MED ORDER — HYDROCHLOROTHIAZIDE 12.5 MG PO CAPS: 12.5000 mg | ORAL_CAPSULE | Freq: Every day | ORAL | Status: DC

## 2015-12-04 MED ORDER — FENOFIBRATE 54 MG PO TABS: 54.0000 mg | ORAL_TABLET | Freq: Every day | ORAL | Status: DC

## 2015-12-05 ENCOUNTER — Other Ambulatory Visit: Payer: Self-pay | Admitting: Family Medicine

## 2015-12-11 ENCOUNTER — Encounter: Payer: Medicare Other | Admitting: Family Medicine

## 2015-12-16 ENCOUNTER — Ambulatory Visit (INDEPENDENT_AMBULATORY_CARE_PROVIDER_SITE_OTHER): Payer: Medicare Other | Admitting: Family Medicine

## 2015-12-16 ENCOUNTER — Encounter: Payer: Self-pay | Admitting: Family Medicine

## 2015-12-16 VITALS — BP 130/70 | HR 60 | Ht 67.5 in | Wt 169.0 lb

## 2015-12-16 DIAGNOSIS — Z1159 Encounter for screening for other viral diseases: Secondary | ICD-10-CM | POA: Diagnosis not present

## 2015-12-16 DIAGNOSIS — N522 Drug-induced erectile dysfunction: Secondary | ICD-10-CM | POA: Insufficient documentation

## 2015-12-16 DIAGNOSIS — Z23 Encounter for immunization: Secondary | ICD-10-CM

## 2015-12-16 DIAGNOSIS — E785 Hyperlipidemia, unspecified: Secondary | ICD-10-CM

## 2015-12-16 DIAGNOSIS — Z0000 Encounter for general adult medical examination without abnormal findings: Secondary | ICD-10-CM

## 2015-12-16 DIAGNOSIS — I6389 Other cerebral infarction: Secondary | ICD-10-CM

## 2015-12-16 DIAGNOSIS — Z125 Encounter for screening for malignant neoplasm of prostate: Secondary | ICD-10-CM

## 2015-12-16 DIAGNOSIS — Z889 Allergy status to unspecified drugs, medicaments and biological substances status: Secondary | ICD-10-CM | POA: Diagnosis not present

## 2015-12-16 DIAGNOSIS — I10 Essential (primary) hypertension: Secondary | ICD-10-CM | POA: Diagnosis not present

## 2015-12-16 DIAGNOSIS — H179 Unspecified corneal scar and opacity: Secondary | ICD-10-CM | POA: Diagnosis not present

## 2015-12-16 DIAGNOSIS — Z789 Other specified health status: Secondary | ICD-10-CM

## 2015-12-16 DIAGNOSIS — I638 Other cerebral infarction: Secondary | ICD-10-CM

## 2015-12-16 LAB — CBC WITH DIFFERENTIAL/PLATELET
BASOS PCT: 1 %
Basophils Absolute: 61 cells/uL (ref 0–200)
Eosinophils Absolute: 122 cells/uL (ref 15–500)
Eosinophils Relative: 2 %
HEMATOCRIT: 38.6 % (ref 38.5–50.0)
HEMOGLOBIN: 13 g/dL — AB (ref 13.2–17.1)
LYMPHS ABS: 1647 {cells}/uL (ref 850–3900)
Lymphocytes Relative: 27 %
MCH: 29.2 pg (ref 27.0–33.0)
MCHC: 33.7 g/dL (ref 32.0–36.0)
MCV: 86.7 fL (ref 80.0–100.0)
MONO ABS: 610 {cells}/uL (ref 200–950)
MPV: 10.9 fL (ref 7.5–12.5)
Monocytes Relative: 10 %
NEUTROS PCT: 60 %
Neutro Abs: 3660 cells/uL (ref 1500–7800)
Platelets: 198 10*3/uL (ref 140–400)
RBC: 4.45 MIL/uL (ref 4.20–5.80)
RDW: 13.8 % (ref 11.0–15.0)
WBC: 6.1 10*3/uL (ref 4.0–10.5)

## 2015-12-16 LAB — LIPID PANEL
Cholesterol: 149 mg/dL (ref 125–200)
HDL: 41 mg/dL (ref >=40)
LDL CALC: 95 mg/dL (ref <130)
Total CHOL/HDL Ratio: 3.6 Ratio (ref <=5.0)
Triglycerides: 66 mg/dL (ref <150)
VLDL: 13 mg/dL (ref <30)

## 2015-12-16 LAB — HEPATITIS C ANTIBODY: HCV AB: NEGATIVE

## 2015-12-16 LAB — COMPREHENSIVE METABOLIC PANEL
ALK PHOS: 39 U/L — AB (ref 40–115)
ALT: 14 U/L (ref 9–46)
AST: 24 U/L (ref 10–35)
Albumin: 4 g/dL (ref 3.6–5.1)
BILIRUBIN TOTAL: 0.5 mg/dL (ref 0.2–1.2)
BUN: 27 mg/dL — AB (ref 7–25)
CALCIUM: 9.1 mg/dL (ref 8.6–10.3)
CO2: 27 mmol/L (ref 20–31)
Chloride: 104 mmol/L (ref 98–110)
Creat: 0.95 mg/dL (ref 0.70–1.25)
Glucose, Bld: 88 mg/dL (ref 65–99)
POTASSIUM: 4.2 mmol/L (ref 3.5–5.3)
Sodium: 139 mmol/L (ref 135–146)
TOTAL PROTEIN: 6.5 g/dL (ref 6.1–8.1)

## 2015-12-16 LAB — POCT URINALYSIS DIPSTICK
Bilirubin, UA: NEGATIVE
Blood, UA: NEGATIVE
GLUCOSE UA: NEGATIVE
Ketones, UA: NEGATIVE
Leukocytes, UA: NEGATIVE
Nitrite, UA: NEGATIVE
Protein, UA: NEGATIVE
UROBILINOGEN UA: NEGATIVE
pH, UA: 5.5

## 2015-12-16 MED ORDER — TADALAFIL 5 MG PO TABS: 5.0000 mg | ORAL_TABLET | Freq: Every day | ORAL | Status: DC | PRN

## 2015-12-16 MED ORDER — FENOFIBRATE 54 MG PO TABS: 54.0000 mg | ORAL_TABLET | Freq: Every day | ORAL | Status: DC

## 2015-12-16 MED ORDER — CLOPIDOGREL BISULFATE 75 MG PO TABS: ORAL_TABLET | ORAL | Status: DC

## 2015-12-16 MED ORDER — HYDROCHLOROTHIAZIDE 12.5 MG PO CAPS: 12.5000 mg | ORAL_CAPSULE | Freq: Every day | ORAL | Status: DC

## 2015-12-16 NOTE — Progress Notes (Signed)
Subjective:   HPI  Jose Dyer is a 65 y.o. male who presents for a complete physical and a annual wellness visit.He does have a previous history of CVA and does follow up regularly with his neurologist. He also has a previous history of right corneal scarring dating back to several decades. This has caused visual impairment. He does have hypertension and continues on medication for this. He also has statin intolerance and presently is on fenofibrate. He does have intermittent difficulty with erectile dysfunction and does use Cialis on an as-needed basis. He does have an advanced directive. No evidence of depression. He keeps himself quite physically active playing golf and walking. He is retired. His marriage is going well. Family and social history as well as health maintenance and immunizations were reviewed.  Medical care team includes:  Aquino neur. ajlouny foot  Justin Mend   Preventative care: Last ophthalmology visit:2016 Last dental visit:2 months ago  Last colonoscopy:2010 Last prostate exam: last year Last EKG:12/2013 Last labs:today  Prior vaccinations: TD or Tdap:09/26/08 Influenza:2016 Pneumococcal:13 06/28/14 Shingles/Zostavax:11/16/11 Other:   Advanced directive: Health care power of attorney: Living will:  Reviewed their medical, surgical, family, social, medication, and allergy history and updated chart as appropriate.    Review of Systems Constitutional: -fever, -chills, -sweats, -unexpected weight change, -decreased appetite, -fatigue Allergy: -sneezing, -itching, -congestion Dermatology: -changing moles, --rash, -lumps ENT: -runny nose, -ear pain, -sore throat, -hoarseness, -sinus pain, -teeth pain, - ringing in ears, -hearing loss, -nosebleeds Cardiology: -chest pain, -palpitations, -swelling, -difficulty breathing when lying flat, -waking up short of breath Respiratory: -cough, -shortness of breath, -difficulty breathing with exercise or exertion, -wheezing,  -coughing up blood Gastroenterology: -abdominal pain, -nausea, -vomiting, -diarrhea, -constipation, -blood in stool, -changes in bowel movement, -difficulty swallowing or eating Hematology: -bleeding, -bruising  Musculoskeletal: -joint aches, -muscle aches, -joint swelling, -back pain, -neck pain, -cramping, -changes in gait Ophthalmology: denies vision changes, eye redness, itching, discharge Urology: -burning with urination, -difficulty urinating, -blood in urine, -urinary frequency, -urgency, -incontinence Neurology: -headache, -weakness, -tingling, -numbness, -memory loss, -falls, -dizziness Psychology: -depressed mood, -agitation, -sleep problems     Objective:   Physical Exam   General appearance: alert, no distress, WD/WN,  Skin: HEENT: normocephalic, conjunctiva normal, right cornea does show scarring medially,sclerae anicteric, PERRLA, EOMi, nares patent, no discharge or erythema, pharynx normal Oral cavity: MMM, tongue normal, teeth normal Neck: supple, no lymphadenopathy, no thyromegaly, no masses, normal ROM Chest: non tender, normal shape and expansion Heart: RRR, normal S1, S2, no murmurs Lungs: CTA bilaterally, no wheezes, rhonchi, or rales Abdomen: +bs, soft, non tender, non distended, no masses, no hepatomegaly, no splenomegaly, no bruits Back: non tender, normal ROM, no scoliosis Musculoskeletal: upper extremities non tender, no obvious deformity, normal ROM throughout, lower extremities non tender, no obvious deformity, normal ROM throughout Extremities: no edema, no cyanosis, no clubbing Pulses: 2+ symmetric, upper and lower extremities, normal cap refill Neurological: alert, oriented x 3, CN2-12 intact, strength normal upper extremities and lower extremities, sensation normal throughout, DTRs 2+ throughout, no cerebellar signs, gait normal Psychiatric: normal affect, behavior normal, pleasant     Assessment and Plan :   Annual physical exam - Plan: POCT  Urinalysis Dipstick, CBC with Differential/Platelet, Comprehensive metabolic panel, tadalafil (CIALIS) 5 MG tablet  Statin intolerance - Plan: Lipid panel  Essential hypertension - Plan: CBC with Differential/Platelet, Comprehensive metabolic panel, hydrochlorothiazide (MICROZIDE) 12.5 MG capsule  Cerebral infarction due to other mechanism (HCC) - Plan: CBC with Differential/Platelet, Comprehensive metabolic panel, Lipid panel, clopidogrel (  PLAVIX) 75 MG tablet  Hyperlipidemia LDL goal <100 - Plan: Lipid panel, fenofibrate 54 MG tablet  Drug-induced erectile dysfunction - Plan: CBC with Differential/Platelet, Comprehensive metabolic panel, Lipid panel  Need for prophylactic vaccination against Streptococcus pneumoniae (pneumococcus) - Plan: Pneumococcal polysaccharide vaccine 23-valent greater than or equal to 2yo subcutaneous/IM  Screening for prostate cancer - Plan: PSA, Medicare  Need for hepatitis C screening test - Plan: Hepatitis C antibody I encouraged him to continue to take care of himself which he is doing an excellent job of accomplishing. Physical exam - discussed healthy lifestyle, diet, exercise, preventative care, vaccinations, and addressed their concerns.        Follow-up Yearly

## 2015-12-17 LAB — PSA, MEDICARE: PSA: 2.14 ng/mL (ref <=4.00)

## 2015-12-25 ENCOUNTER — Other Ambulatory Visit: Payer: Self-pay | Admitting: Family Medicine

## 2016-01-22 ENCOUNTER — Other Ambulatory Visit: Payer: Self-pay | Admitting: Family Medicine

## 2016-04-02 DIAGNOSIS — L821 Other seborrheic keratosis: Secondary | ICD-10-CM | POA: Diagnosis not present

## 2016-04-10 ENCOUNTER — Other Ambulatory Visit: Payer: Self-pay | Admitting: Family Medicine

## 2016-04-16 DIAGNOSIS — L821 Other seborrheic keratosis: Secondary | ICD-10-CM | POA: Diagnosis not present

## 2016-08-31 NOTE — Telephone Encounter (Signed)
dt ?

## 2016-09-14 ENCOUNTER — Ambulatory Visit (HOSPITAL_COMMUNITY)
Admission: EM | Admit: 2016-09-14 | Discharge: 2016-09-14 | Disposition: A | Discharge status: Home / Self Care | Payer: Medicare Other | Attending: Emergency Medicine | Admitting: Emergency Medicine

## 2016-09-14 ENCOUNTER — Encounter (HOSPITAL_COMMUNITY): Payer: Self-pay | Admitting: *Deleted

## 2016-09-14 DIAGNOSIS — R42 Dizziness and giddiness: Secondary | ICD-10-CM | POA: Diagnosis not present

## 2016-09-14 DIAGNOSIS — E860 Dehydration: Secondary | ICD-10-CM

## 2016-09-14 MED ORDER — SODIUM CHLORIDE 0.9 % IV BOLUS (SEPSIS)
1000.0000 mL | Freq: Once | INTRAVENOUS | Status: AC
Start: 2016-09-14 — End: 2016-09-14
  Administered 2016-09-14: 1000 mL via INTRAVENOUS

## 2016-09-14 NOTE — ED Triage Notes (Signed)
Pt    Reports        Symptoms  Of  dizzyness       Nausea            Vomited      Pt  Reports       Symptoms    No   Pain    Weak           Able  To  Walk         Better  Standing  Up

## 2016-09-14 NOTE — ED Provider Notes (Signed)
[facility]    CSN: [id number] Arrival date & time: 09/14/16  [place]     History   Chief Complaint Chief Complaint  Patient presents with  . Dizziness    HPI Jose Dyer is a 66 y.o. male.   HPI He is a 66 year old man here for evaluation of dizziness. He states this morning he got up and went to the kitchen to make some breakfast. When he stood up, he became very lightheaded. This was associated with stomach pain, nausea, vomiting, diaphoresis, and shortness of breath. Symptoms resolve if he lays down. He has not had anything to eat or drink today due to persistent nausea when he stands up. He did have one episode of this on Saturday. That episode was after walking on a treadmill for 1 hour and then taking a warm shower. He denies any chest pain. No leg swelling. No syncopal event.  Past Medical History:  Diagnosis Date  . Corneal scarring   . Dyslipidemia   . FHx: cardiovascular disease   . FHx: colon cancer   . Hypertension     Patient Active Problem List   Diagnosis Date Noted  . Corneal scarring 12/16/2015  . Drug-induced erectile dysfunction 12/16/2015  . Statin intolerance 06/28/2014  . CVA (cerebral infarction) 01/30/2014  . Hyperlipidemia LDL goal <100 01/30/2014  . Hypertension 12/15/2012    Past Surgical History:  Procedure Laterality Date  . COLONOSCOPY  2004       Home Medications    Prior to Admission medications   Medication Sig Start Date End Date Taking? Authorizing Provider  aspirin 81 MG tablet Take 81 mg by mouth daily.    Historical Provider, MD  clopidogrel (PLAVIX) 75 MG tablet TAKE 1 TABLET DAILY/NEEDS RETURN OFFICE VISIT BEFORE MORE REFILLS ARE AUTH. 12/16/15   Denita Lung, MD  clopidogrel (PLAVIX) 75 MG tablet TAKE 1 TABLET DAILY/NEEDS RETURN OFFICE VISIT BEFORE MORE REFILLS ARE AUTH. 12/25/15   Denita Lung, MD  co-enzyme Q-10 30 MG capsule Take 30 mg by mouth 3 (three) times daily.    Historical Provider, MD  fenofibrate  54 MG tablet Take 1 tablet (54 mg total) by mouth daily. 12/16/15   Denita Lung, MD  fenofibrate 54 MG tablet TAKE 1 TABLET (54 MG TOTAL) BY MOUTH DAILY. 12/25/15   Denita Lung, MD  fenofibrate 54 MG tablet Take 1 tablet by mouth  daily 04/13/16   Denita Lung, MD  hydrochlorothiazide (MICROZIDE) 12.5 MG capsule Take 1 capsule by mouth  daily 04/13/16   Denita Lung, MD  Multiple Vitamins-Minerals (MULTIVITAMIN WITH MINERALS) tablet Take 1 tablet by mouth daily.    Historical Provider, MD  tadalafil (CIALIS) 5 MG tablet Take 1 tablet (5 mg total) by mouth daily as needed for erectile dysfunction. 12/16/15   Denita Lung, MD    Family History History reviewed. No pertinent family history.  Social History Social History  Substance Use Topics  . Smoking status: Never Smoker  . Smokeless tobacco: Never Used  . Alcohol use No     Allergies   Patient has no known allergies.   Review of Systems Review of Systems As in history of present illness  Physical Exam Triage Vital Signs ED Triage Vitals [09/14/16 1903]  Enc Vitals Group     BP 150/84     Pulse Rate 78     Resp 18     Temp 98.6 F (37 C)     Temp  Source Oral     SpO2 100 %     Weight      Height      Head Circumference      Peak Flow      Pain Score      Pain Loc      Pain Edu?      Excl. in [place]?    Orthostatic VS for the past 24 hrs:  BP- Lying Pulse- Lying BP- Sitting Pulse- Sitting BP- Standing at 0 minutes Pulse- Standing at 0 minutes  09/14/16 2020 130/62 71 150/77 84 156/78 93    Updated Vital Signs BP 90/60   Pulse 78   Temp 98.6 F (37 C) (Oral)   Resp 18   SpO2 100%   Visual Acuity Right Eye Distance:   Left Eye Distance:   Bilateral Distance:    Right Eye Near:   Left Eye Near:    Bilateral Near:     Physical Exam  Constitutional: He is oriented to person, place, and time. He appears well-developed and well-nourished. No distress.  HENT:  TMs normal bilaterally  Neck: Neck  supple.  Cardiovascular: Normal rate, regular rhythm and normal heart sounds.   No murmur heard. Pulmonary/Chest: Effort normal.  Musculoskeletal: He exhibits no edema.  Neurological: He is alert and oriented to person, place, and time.     UC Treatments / Results  Labs (all labs ordered are listed, but only abnormal results are displayed) Labs Reviewed - No data to display  EKG  EKG Interpretation None       Radiology No results found.  Procedures ED EKG Date/Time: 09/14/2016 7:54 PM Performed by: Melony Overly Authorized by: Melony Overly   ECG reviewed by ED Physician in the absence of a cardiologist: yes   Previous ECG:    Previous ECG:  Unavailable Interpretation:    Interpretation: normal   Rate:    ECG rate:  83   ECG rate assessment: normal   Rhythm:    Rhythm: sinus rhythm   Ectopy:    Ectopy: none   QRS:    QRS axis:  Normal   QRS intervals:  Normal Conduction:    Conduction: normal   ST segments:    ST segments:  Normal T waves:    T waves: normal   Comments:     NSR, normal ekg   (including critical care time)  Medications Ordered in UC Medications  sodium chloride 0.9 % bolus 1,000 mL (1,000 mLs Intravenous Given 09/14/16 1949)     Initial Impression / Assessment and Plan / UC Course  I have reviewed the triage vital signs and the nursing notes.  Pertinent labs & imaging results that were available during my care of the patient were reviewed by me and considered in my medical decision making (see chart for details).     Initially significantly orthostatic.  Orthostatics normal after 1 L bolus. Patient reports feeling much improved. Discussed relative rest and increased fluid intake for the next several days. If symptoms are recurrent he will follow-up with if he develops chest pain or syncope, he is to go to the emergency room.  Final Clinical Impressions(s) / UC Diagnoses   Final diagnoses:  Dehydration  Orthostatic dizziness     New Prescriptions Discharge Medication List as of 09/14/2016  8:28 PM       Melony Overly, MD 09/14/16 2044

## 2016-09-14 NOTE — ED Notes (Signed)
Pt     Feels  Better  After  Iv  Fluid

## 2016-09-14 NOTE — Discharge Instructions (Signed)
You were experiencing something called orthostatic hypotension. This has resolved after getting some fluids. Please take it easy for the next few days. You can eat whatever you like. Please make sure you are drinking lots of fluids. If this becomes a recurrent problem, please follow-up with your primary care doctor. If you actually pass out or have crushing chest pain, please go to the emergency room.

## 2016-09-14 NOTE — ED Notes (Signed)
Patient's 1000cc Normal Saline Bolus completed.

## 2016-09-21 ENCOUNTER — Ambulatory Visit (INDEPENDENT_AMBULATORY_CARE_PROVIDER_SITE_OTHER): Payer: Medicare Other | Admitting: Family Medicine

## 2016-09-21 ENCOUNTER — Telehealth: Payer: Self-pay

## 2016-09-21 VITALS — BP 140/70 | HR 81 | Resp 18 | Wt 175.8 lb

## 2016-09-21 DIAGNOSIS — I951 Orthostatic hypotension: Secondary | ICD-10-CM

## 2016-09-21 DIAGNOSIS — R079 Chest pain, unspecified: Secondary | ICD-10-CM | POA: Diagnosis not present

## 2016-09-21 LAB — POCT HEMOGLOBIN: Hemoglobin: 9 g/dL — AB (ref 14.1–18.1)

## 2016-09-21 LAB — HEMOCCULT GUIAC POC 1CARD (OFFICE): FECAL OCCULT BLD: NEGATIVE

## 2016-09-21 NOTE — Progress Notes (Signed)
   Subjective:    Patient ID: Jose Dyer, male    DOB: [birth year], 66 y.o.   MRN: [id number]  HPI He is here for consult concerning recent emergency room visit. He originally went into the emergency room for evaluation of dizziness with vomiting. He did not mention the fact that he did vomit up a black material. He also states that he had one episode of black stool one day prior to that. In the emergency room they evaluated and treated him without that information and did treat him for postural hypotension. He said last Wednesday after he left the hospital he started noticing exertional type chest pain. He can walk without difficulty but he walks up a hill while playing golf he will notice chest tightness with radiation into the left arm. No previous history of chest pain and negative family history. He does not smoke. His have a previous history of CVA as well as hyperlipidemia with statin intolerance. Has history of hypertension and is on HCTZ.   Review of Systems     Objective:   Physical Exam Alert and in no distress. Tympanic membranes and canals are normal. Pharyngeal area is normal. Neck is supple without adenopathy or thyromegaly. Cardiac exam shows a regular sinus rhythm without murmurs or gallops. Lungs are clear to auscultation. EKG shows no acute changes. His stool was guaiac negative. Postural signs are recorded and are positive Stool is guaiac negative     Assessment & Plan:  Exertional chest pain  Postural hypotension  Case was discussed with Dr. Einar Gip concerning his chest pain. He will see him in consultation. He does have postural signs his stool was guaiac negative. I think the safest would be to have him evaluated and admitted to work up both the possible GI bleed and history consistent with new onset angina.

## 2016-09-21 NOTE — Progress Notes (Signed)
Called and gave report to Cindi, triage nurse at [facility] per [facility] request. Jose Dyer

## 2016-09-21 NOTE — Telephone Encounter (Signed)
Pt called the office with confusion on what he is supposed to be doing as far as E.D eval. Pt presented to Admissions and he was told no one knew what to do with him.  Advised pt he needs to go to E.D. And check in. Advised him that I have talked to [name] twice about his complaints of chest pain and possible GI bleed.  [name] knows pt is on his way back to hospital. [name] was told to ask for [name] when he arrived to the E.D. Jose Dyer

## 2016-09-22 ENCOUNTER — Ambulatory Visit
Admission: RE | Admit: 2016-09-22 | Discharge: 2016-09-22 | Disposition: A | Discharge status: Home / Self Care | Payer: Medicare Other | Source: Ambulatory Visit | Attending: Cardiology | Admitting: Cardiology

## 2016-09-22 ENCOUNTER — Other Ambulatory Visit: Payer: Self-pay | Admitting: Cardiology

## 2016-09-22 ENCOUNTER — Telehealth: Payer: Self-pay

## 2016-09-22 ENCOUNTER — Ambulatory Visit (INDEPENDENT_AMBULATORY_CARE_PROVIDER_SITE_OTHER): Payer: Medicare Other | Admitting: Family Medicine

## 2016-09-22 ENCOUNTER — Encounter: Payer: Self-pay | Admitting: Family Medicine

## 2016-09-22 ENCOUNTER — Encounter: Payer: Self-pay | Admitting: Gastroenterology

## 2016-09-22 VITALS — BP 150/70 | HR 78 | Wt 182.6 lb

## 2016-09-22 DIAGNOSIS — R079 Chest pain, unspecified: Secondary | ICD-10-CM

## 2016-09-22 DIAGNOSIS — I10 Essential (primary) hypertension: Secondary | ICD-10-CM | POA: Diagnosis not present

## 2016-09-22 DIAGNOSIS — E785 Hyperlipidemia, unspecified: Secondary | ICD-10-CM | POA: Diagnosis not present

## 2016-09-22 DIAGNOSIS — D649 Anemia, unspecified: Secondary | ICD-10-CM | POA: Diagnosis not present

## 2016-09-22 DIAGNOSIS — K922 Gastrointestinal hemorrhage, unspecified: Secondary | ICD-10-CM | POA: Diagnosis not present

## 2016-09-22 DIAGNOSIS — Z8673 Personal history of transient ischemic attack (TIA), and cerebral infarction without residual deficits: Secondary | ICD-10-CM | POA: Diagnosis not present

## 2016-09-22 DIAGNOSIS — I209 Angina pectoris, unspecified: Secondary | ICD-10-CM | POA: Diagnosis not present

## 2016-09-22 LAB — CBC WITH DIFFERENTIAL/PLATELET
Basophils Absolute: 0 cells/uL (ref 0–200)
Basophils Relative: 0 %
EOS PCT: 3 %
Eosinophils Absolute: 177 cells/uL (ref 15–500)
HCT: 28.1 % — ABNORMAL LOW (ref 38.5–50.0)
HEMOGLOBIN: 9.3 g/dL — AB (ref 13.2–17.1)
LYMPHS ABS: 1357 {cells}/uL (ref 850–3900)
Lymphocytes Relative: 23 %
MCH: 29.6 pg (ref 27.0–33.0)
MCHC: 33.1 g/dL (ref 32.0–36.0)
MCV: 89.5 fL (ref 80.0–100.0)
MONOS PCT: 9 %
MPV: 10.2 fL (ref 7.5–12.5)
Monocytes Absolute: 531 cells/uL (ref 200–950)
NEUTROS PCT: 65 %
Neutro Abs: 3835 cells/uL (ref 1500–7800)
PLATELETS: 265 10*3/uL (ref 140–400)
RBC: 3.14 MIL/uL — AB (ref 4.20–5.80)
RDW: 14 % (ref 11.0–15.0)
WBC: 5.9 10*3/uL (ref 4.0–10.5)

## 2016-09-22 LAB — COMPREHENSIVE METABOLIC PANEL
ALBUMIN: 3.8 g/dL (ref 3.6–5.1)
ALT: 11 U/L (ref 9–46)
AST: 18 U/L (ref 10–35)
Alkaline Phosphatase: 44 U/L (ref 40–115)
BUN: 24 mg/dL (ref 7–25)
CHLORIDE: 107 mmol/L (ref 98–110)
CO2: 28 mmol/L (ref 20–31)
CREATININE: 0.98 mg/dL (ref 0.70–1.25)
Calcium: 8.9 mg/dL (ref 8.6–10.3)
GLUCOSE: 108 mg/dL — AB (ref 65–99)
Potassium: 4 mmol/L (ref 3.5–5.3)
SODIUM: 142 mmol/L (ref 135–146)
Total Bilirubin: 0.3 mg/dL (ref 0.2–1.2)
Total Protein: 6.4 g/dL (ref 6.1–8.1)

## 2016-09-22 LAB — LIPID PANEL
Cholesterol: 154 mg/dL (ref <200)
HDL: 36 mg/dL — ABNORMAL LOW (ref >40)
LDL CALC: 80 mg/dL (ref <100)
Total CHOL/HDL Ratio: 4.3 Ratio (ref <5.0)
Triglycerides: 190 mg/dL — ABNORMAL HIGH (ref <150)
VLDL: 38 mg/dL — AB (ref <30)

## 2016-09-22 LAB — POCT HEMOGLOBIN: HEMOGLOBIN: 9 g/dL — AB (ref 14.1–18.1)

## 2016-09-22 NOTE — Addendum Note (Signed)
Addended by: Arley Phenix L on: 09/22/2016 09:26 AM   Modules accepted: Orders

## 2016-09-22 NOTE — Telephone Encounter (Signed)
Pt rescheduled for 10/01/15 with Alen Blew at 2:15PM. Pt aware of appt and location. Victorino December

## 2016-09-22 NOTE — Telephone Encounter (Signed)
Called [facility]- they state pt declined to see a PA and will only see a doctor. If he were to see a PA they can get him next week, but since he will only see a MD first available is March 2018. Jose Dyer

## 2016-09-22 NOTE — Progress Notes (Signed)
   Subjective:    Patient ID: Jose Dyer, male    DOB: 01-Jul-1951, 66 y.o.   MRN: [id number]  HPI He is here for recheck. He was seen yesterday and did go to the emergency room unfortunately did not get taken care of there and left before being seen. Apparently it was a several hour wait in the emergency room and they told him it could take as long as 8 hours to be seen. He has had no more difficulty with chest pain, shortness of breath.   Review of Systems     Objective:   Physical Exam Alert and in no distress. Postural signs are normal His hemoglobin is 9.       Assessment & Plan:  Hemoglobin low - Plan: Hemoglobin, Ambulatory referral to Gastroenterology, CBC with Differential/Platelet  Exertional chest pain - Plan: CBC with Differential/Platelet, Comprehensive metabolic panel, Lipid panel  Essential hypertension - Plan: CBC with Differential/Platelet, Comprehensive metabolic panel  Hyperlipidemia LDL goal <100 - Plan: Lipid panel Arrangements were made for him to be seen by Dr. Einar Gip for further evaluation of his new onset angina. He will also be scheduled for GI. He seems be stable from a hemoglobin point of view however he was hemoglobin 13 and now 9 and further GI evaluation will be needed.

## 2016-09-23 DIAGNOSIS — I10 Essential (primary) hypertension: Secondary | ICD-10-CM | POA: Diagnosis not present

## 2016-09-23 DIAGNOSIS — I209 Angina pectoris, unspecified: Secondary | ICD-10-CM | POA: Diagnosis not present

## 2016-09-23 DIAGNOSIS — Z8673 Personal history of transient ischemic attack (TIA), and cerebral infarction without residual deficits: Secondary | ICD-10-CM | POA: Diagnosis not present

## 2016-09-28 DIAGNOSIS — I200 Unstable angina: Secondary | ICD-10-CM | POA: Diagnosis not present

## 2016-09-28 DIAGNOSIS — I10 Essential (primary) hypertension: Secondary | ICD-10-CM | POA: Diagnosis not present

## 2016-09-28 DIAGNOSIS — Z8673 Personal history of transient ischemic attack (TIA), and cerebral infarction without residual deficits: Secondary | ICD-10-CM | POA: Diagnosis not present

## 2016-09-30 ENCOUNTER — Ambulatory Visit (INDEPENDENT_AMBULATORY_CARE_PROVIDER_SITE_OTHER): Payer: Medicare Other | Admitting: Physician Assistant

## 2016-09-30 ENCOUNTER — Telehealth: Payer: Self-pay | Admitting: Emergency Medicine

## 2016-09-30 ENCOUNTER — Encounter: Payer: Self-pay | Admitting: Physician Assistant

## 2016-09-30 ENCOUNTER — Other Ambulatory Visit (INDEPENDENT_AMBULATORY_CARE_PROVIDER_SITE_OTHER): Payer: Medicare Other

## 2016-09-30 VITALS — BP 152/80 | Ht 68.0 in | Wt 175.0 lb

## 2016-09-30 DIAGNOSIS — K921 Melena: Secondary | ICD-10-CM | POA: Diagnosis not present

## 2016-09-30 DIAGNOSIS — D649 Anemia, unspecified: Secondary | ICD-10-CM | POA: Diagnosis not present

## 2016-09-30 LAB — CBC WITH DIFFERENTIAL/PLATELET
BASOS ABS: 0.1 10*3/uL (ref 0.0–0.1)
Basophils Relative: 0.9 % (ref 0.0–3.0)
Eosinophils Absolute: 0.1 10*3/uL (ref 0.0–0.7)
Eosinophils Relative: 2 % (ref 0.0–5.0)
HCT: 28 % — ABNORMAL LOW (ref 39.0–52.0)
Hemoglobin: 9.4 g/dL — ABNORMAL LOW (ref 13.0–17.0)
LYMPHS PCT: 31.9 % (ref 12.0–46.0)
Lymphs Abs: 2.1 10*3/uL (ref 0.7–4.0)
MCHC: 33.5 g/dL (ref 30.0–36.0)
MCV: 86.8 fl (ref 78.0–100.0)
MONOS PCT: 11.4 % (ref 3.0–12.0)
Monocytes Absolute: 0.7 10*3/uL (ref 0.1–1.0)
NEUTROS PCT: 53.8 % (ref 43.0–77.0)
Neutro Abs: 3.5 10*3/uL (ref 1.4–7.7)
Platelets: 251 10*3/uL (ref 150.0–400.0)
RBC: 3.23 Mil/uL — AB (ref 4.22–5.81)
RDW: 13.4 % (ref 11.5–15.5)
WBC: 6.5 10*3/uL (ref 4.0–10.5)

## 2016-09-30 LAB — IBC PANEL
Iron: 180 ug/dL — ABNORMAL HIGH (ref 42–165)
SATURATION RATIOS: 39 % (ref 20.0–50.0)
TRANSFERRIN: 330 mg/dL (ref 212.0–360.0)

## 2016-09-30 LAB — FERRITIN: FERRITIN: 15.2 ng/mL — AB (ref 22.0–322.0)

## 2016-09-30 NOTE — Patient Instructions (Addendum)
You have been scheduled for an endoscopy. Please follow written instructions given to you at your visit today. If you use inhalers (even only as needed), please bring them with you on the day of your procedure. Your physician has requested that you go to www.startemmi.com and enter the access code given to you at your visit today. This web site gives a general overview about your procedure. However, you should still follow specific instructions given to you by our office regarding your preparation for the procedure.  Continue iron daily.   Continue pantoprazole 40 mg daily.   Your physician has requested that you go to the basement for lab work before leaving today.

## 2016-09-30 NOTE — Progress Notes (Signed)
Reviewed and agree with management plan.  Maanvi Lecompte T. Natthew Marlatt, MD FACG 

## 2016-09-30 NOTE — Progress Notes (Signed)
Chief Complaint: Melena, anemia  HPI:  Jose Dyer is a 66 year old male with a past medical history of dyslipidemia and htn, who was referred to me by Denita Lung, MD for a complaint of melena and anemia.     Patient has previously followed with Dr. Fuller Plan with his last screening colonoscopy 10/24/08 with findings of mild diverticulosis in the sigmoid colon and small internal hemorrhoids. It was recommended he have repeat in 10 years.   Patient's most recent labs include a CBC on 09/22/16 showing a hemoglobin 9.3, this is actually up 0.3 increased from 9.0 on 09/21/16.     Today, the patient tells me that 3 sundays ago he got up and had his normal bowel movement in the morning but this was very "black" in color. He tells me that he thought possibly this could've been the "Black beans" he ate for dinner the night before. He felt normal that day and then on Monday morning had another black bowel movement. He then felt dizzy upon standing up and started with some nausea and "buzzing" in his ears. He figured that he had vertigo as he has had this before and laid down in bed, but continued to be nauseous and dizzy throughout the day. He did have 1 episode of vomiting a "sludgy black material", later that day. He proceeded to their urgent care and was diagnosed with orthostatic hypotension, they gave him IV fluids. Patient does add that day he also had an episode of chest pain when running on the treadmill. He did see his PCP regarding these complaints recently and was found to have a hemoglobin decreased to 9 from his normal around 12. He was referred to the cardiologist for c/p and has already had an echo as well as a stress test by Dr. Einar Gip. His cardiologist recently started him on pantoprazole 40 mg daily on the 6th of feb and he started over-the-counter iron supplement 3-4 days ago per recommendations from a friend. Patient tells me that he has seen no further black stools since February 5 and has been  feeling "fine". He tells me he has been running on the treadmill as usual.   Patient is on chronic anticoagulation with Plavix for history of a stroke.    Patient denies fever, chills, weight loss, anorexia, heartburn, reflux or continued abdominal pain.    Past Medical History:  Diagnosis Date  . Corneal scarring   . Dyslipidemia   . FHx: cardiovascular disease   . FHx: colon cancer   . Hypertension     Past Surgical History:  Procedure Laterality Date  . COLONOSCOPY  2004    Current Outpatient Prescriptions  Medication Sig Dispense Refill  . aspirin 81 MG tablet Take 81 mg by mouth daily.    . cholecalciferol (VITAMIN D) 1000 units tablet Take 1,000 Units by mouth daily.    . clopidogrel (PLAVIX) 75 MG tablet TAKE 1 TABLET DAILY/NEEDS RETURN OFFICE VISIT BEFORE MORE REFILLS ARE AUTH. 90 tablet 3  . co-enzyme Q-10 30 MG capsule Take 30 mg by mouth 3 (three) times daily.    . cyanocobalamin 100 MCG tablet Take 100 mcg by mouth daily.    . fenofibrate 54 MG tablet Take 1 tablet (54 mg total) by mouth daily. 90 tablet 0  . metoprolol tartrate (LOPRESSOR) 25 MG tablet Take 25 mg by mouth 2 (two) times daily.    . Multiple Vitamins-Minerals (MULTIVITAMIN WITH MINERALS) tablet Take 1 tablet by mouth daily.    Marland Kitchen  pantoprazole (PROTONIX) 40 MG tablet Take 40 mg by mouth daily.    . Pitavastatin Calcium (LIVALO) 2 MG TABS Take 2 mg by mouth. Pt take half of 2mg  tablet    . tadalafil (CIALIS) 5 MG tablet Take 1 tablet (5 mg total) by mouth daily as needed for erectile dysfunction. 30 tablet 0   No current facility-administered medications for this visit.     Allergies as of 09/30/2016  . (No Known Allergies)    Family history: No family history of colon polyps or colon cancer   Social History   Social History  . Marital status: Married    Spouse name: N/A  . Number of children: N/A  . Years of education: N/A   Occupational History  . Not on file.   Social History Main  Topics  . Smoking status: Never Smoker  . Smokeless tobacco: Never Used  . Alcohol use No  . Drug use: No  . Sexual activity: Yes   Other Topics Concern  . Not on file   Social History Narrative  . No narrative on file    Review of Systems:    Constitutional: No weight loss, fever or chills Skin: No rash  Cardiovascular: Positive for chest pain Respiratory: No SOB  Gastrointestinal: See HPI and otherwise negative Genitourinary: No dysuria or change in urinary frequency Neurological: Positive for dizziness Musculoskeletal: No new muscle or joint pain Hematologic: Positive for melena Psychiatric: No history of depression or anxiety   Physical Exam:  Vital signs: BP (!) 152/80   Ht 5\' 8"  (1.727 m)   Wt 175 lb (79.4 kg)   BMI 26.61 kg/m   Constitutional:   Pleasant male appears to be in NAD, Well developed, Well nourished, alert and cooperative Head:  Normocephalic and atraumatic. Eyes:   PEERL, EOMI. No icterus. Conjunctiva pink. Ears:  Normal auditory acuity. Neck:  Supple Throat: Oral cavity and pharynx without inflammation, swelling or lesion.  Respiratory: Respirations even and unlabored. Lungs clear to auscultation bilaterally.   No wheezes, crackles, or rhonchi.  Cardiovascular: Normal S1, S2. No MRG. Regular rate and rhythm. No peripheral edema, cyanosis or pallor.  Gastrointestinal:  Soft, nondistended, nontender. No rebound or guarding. Normal bowel sounds. No appreciable masses or hepatomegaly. Rectal:  Not performed.  Msk:  Symmetrical without gross deformities. Without edema, no deformity or joint abnormality.  Neurologic:  Alert and  oriented x4;  grossly normal neurologically.  Skin:   Dry and intact without significant lesions or rashes. Psychiatric:  Demonstrates good judgement and reason without abnormal affect or behaviors.  MOST RECENT LABS: CBC    Component Value Date/Time   WBC 5.9 09/22/2016 1022   RBC 3.14 (L) 09/22/2016 1022   HGB 9.3 (L)  09/22/2016 1022   HCT 28.1 (L) 09/22/2016 1022   PLT 265 09/22/2016 1022   MCV 89.5 09/22/2016 1022   MCH 29.6 09/22/2016 1022   MCHC 33.1 09/22/2016 1022   RDW 14.0 09/22/2016 1022   LYMPHSABS 1,357 09/22/2016 1022   MONOABS 531 09/22/2016 1022   EOSABS 177 09/22/2016 1022   BASOSABS 0 09/22/2016 1022    CMP     Component Value Date/Time   NA 142 09/22/2016 1022   K 4.0 09/22/2016 1022   CL 107 09/22/2016 1022   CO2 28 09/22/2016 1022   GLUCOSE 108 (H) 09/22/2016 1022   BUN 24 09/22/2016 1022   CREATININE 0.98 09/22/2016 1022   CALCIUM 8.9 09/22/2016 1022   PROT 6.4 09/22/2016 1022  ALBUMIN 3.8 09/22/2016 1022   AST 18 09/22/2016 1022   ALT 11 09/22/2016 1022   ALKPHOS 44 09/22/2016 1022   BILITOT 0.3 09/22/2016 1022    Assessment: 1. Anemia: Thought to be related to acute blood loss as patient does describe history of melena associated with nausea and vomiting of "black material", a few weeks ago, hemoglobin has actually increased to 9.3 from 9.0 recently, will recheck today; Consider PUD vs AVM vs other 2. Melena: Patient describes episode of black tarry stool, stopped on February 5, see above  Plan: 1. Patient is currently undergoing workup for angina by Dr. Einar Gip his cardiologist, he recently had a stress test and does not have results yet. We will need to await cardiac clearance before scheduling patient for EGD. 2. Did discuss EGD with the patient. This will need to be scheduled in the [place] with Dr. Fuller Plan. Discussed risks, benefits, limitations alternatives the patient agrees to proceed. 3. Patient was advised to hold his Plavix for 5 days prior to his procedure. We will communicate with his cardiologist to insure that holding his Plavix is acceptable. 4. Patient to continue on daily ferrous sulfate 5. Patient to continue on Pantoprazole 40 mg daily 6. Ordered a repeat CBC as well as iron studies including ferritin 7. Patient to follow in clinic per recommendations  from Dr. Fuller Plan after time of procedure. Patient was advised that if he sees further black stool and/or has vomiting of black material he should proceed to the ER.  Jose Newer, PA-C [facility] 09/30/2016, 2:35 PM  Cc: Denita Lung, MD

## 2016-09-30 NOTE — Telephone Encounter (Signed)
09/30/2016   RE: Jose Dyer DOB: 02-04-1951 MRN: [id number]   Dear Dr. Einar Gip,    We have scheduled the above patient for an endoscopic procedure. Our records show that he is on anticoagulation therapy.   Please advise as to how long the patient may come off his therapy of Plavix prior to the procedure, which is scheduled for 10-28-16.  Please fax back/ or route the completed form to Tinnie Gens, [place].   Sincerely,   Tinnie Gens, [place]

## 2016-10-01 NOTE — Telephone Encounter (Signed)
Patient informed. He states he is still taking Plavix currently. Also informed him of his lab results. He vebalized understanding.

## 2016-10-05 DIAGNOSIS — I209 Angina pectoris, unspecified: Secondary | ICD-10-CM | POA: Diagnosis not present

## 2016-10-05 DIAGNOSIS — K922 Gastrointestinal hemorrhage, unspecified: Secondary | ICD-10-CM | POA: Diagnosis not present

## 2016-10-05 DIAGNOSIS — E785 Hyperlipidemia, unspecified: Secondary | ICD-10-CM | POA: Diagnosis not present

## 2016-10-05 DIAGNOSIS — Z8673 Personal history of transient ischemic attack (TIA), and cerebral infarction without residual deficits: Secondary | ICD-10-CM | POA: Diagnosis not present

## 2016-10-28 ENCOUNTER — Encounter: Payer: Self-pay | Admitting: Gastroenterology

## 2016-10-28 ENCOUNTER — Ambulatory Visit (AMBULATORY_SURGERY_CENTER): Payer: Medicare Other | Admitting: Gastroenterology

## 2016-10-28 VITALS — BP 118/68 | HR 51 | Temp 98.2°F | Resp 19 | Ht 68.0 in | Wt 175.0 lb

## 2016-10-28 DIAGNOSIS — K2950 Unspecified chronic gastritis without bleeding: Secondary | ICD-10-CM | POA: Diagnosis not present

## 2016-10-28 DIAGNOSIS — K2900 Acute gastritis without bleeding: Secondary | ICD-10-CM

## 2016-10-28 DIAGNOSIS — K921 Melena: Secondary | ICD-10-CM

## 2016-10-28 DIAGNOSIS — K2970 Gastritis, unspecified, without bleeding: Secondary | ICD-10-CM

## 2016-10-28 DIAGNOSIS — K219 Gastro-esophageal reflux disease without esophagitis: Secondary | ICD-10-CM | POA: Diagnosis not present

## 2016-10-28 MED ORDER — SODIUM CHLORIDE 0.9 % IV SOLN: 500.0000 mL | INTRAVENOUS | Status: DC

## 2016-10-28 NOTE — Progress Notes (Signed)
No problems noted in the recovery room. maw 

## 2016-10-28 NOTE — Patient Instructions (Signed)
YOU HAD AN ENDOSCOPIC PROCEDURE TODAY AT [facility]:   Refer to the procedure report that was given to you for any specific questions about what was found during the examination.  If the procedure report does not answer your questions, please call your gastroenterologist to clarify.  If you requested that your care partner not be given the details of your procedure findings, then the procedure report has been included in a sealed envelope for you to review at your convenience later.  YOU SHOULD EXPECT: Some feelings of bloating in the abdomen. Passage of more gas than usual.  Walking can help get rid of the air that was put into your GI tract during the procedure and reduce the bloating. If you had a lower endoscopy (such as a colonoscopy or flexible sigmoidoscopy) you may notice spotting of blood in your stool or on the toilet paper. If you underwent a bowel prep for your procedure, you may not have a normal bowel movement for a few days.  Please Note:  You might notice some irritation and congestion in your nose or some drainage.  This is from the oxygen used during your procedure.  There is no need for concern and it should clear up in a day or so.  SYMPTOMS TO REPORT IMMEDIATELY:    Following upper endoscopy (EGD)  Vomiting of blood or coffee ground material  New chest pain or pain under the shoulder blades  Painful or persistently difficult swallowing  New shortness of breath  Fever of 100F or higher  Black, tarry-looking stools  For urgent or emergent issues, a gastroenterologist can be reached at any hour by calling [phone].   DIET:  We do recommend a small meal at first, but then you may proceed to your regular diet.  Drink plenty of fluids but you should avoid alcoholic beverages for 24 hours.  ACTIVITY:  You should plan to take it easy for the rest of today and you should NOT DRIVE or use heavy machinery until tomorrow (because of the sedation medicines used  during the test).    FOLLOW UP: Our staff will call the number listed on your records the next business day following your procedure to check on you and address any questions or concerns that you may have regarding the information given to you following your procedure. If we do not reach you, we will leave a message.  However, if you are feeling well and you are not experiencing any problems, there is no need to return our call.  We will assume that you have returned to your regular daily activities without incident.  If any biopsies were taken you will be contacted by phone or by letter within the next 1-3 weeks.  Please call us at [phone] if you have not heard about the biopsies in 3 weeks.    SIGNATURES/CONFIDENTIALITY: You and/or your care partner have signed paperwork which will be entered into your electronic medical record.  These signatures attest to the fact that that the information above on your After Visit Summary has been reviewed and is understood.  Full responsibility of the confidentiality of this discharge information lies with you and/or your care-partner.   Handouts were given to your care partner on gastritis and a hiatal hernia. Continue taking PANTOPRAZOLE 40 mg every AM long term. Resume your PLAVIX at prior dose tomorrow per Dr. Fuller Plan. You may resume your other current medications today. Await biopsy results. Please call if any questions or  concerns.

## 2016-10-28 NOTE — Progress Notes (Signed)
Pt states heart rate normally in the 40's

## 2016-10-28 NOTE — Op Note (Addendum)
[place] Patient Name: Jose Dyer Procedure Date: 10/28/2016 9:59 AM MRN: [id number] Endoscopist: Ladene Artist , MD Age: 66 Referring MD:  Date of Birth: Jan 13, 1951 Gender: Male Account #: [id number] Procedure:                Upper GI endoscopy Indications:              Melena Medicines:                Monitored Anesthesia Care Procedure:                Pre-Anesthesia Assessment:                           - Prior to the procedure, a History and Physical                            was performed, and patient medications and                            allergies were reviewed. The patient's tolerance of                            previous anesthesia was also reviewed. The risks                            and benefits of the procedure and the sedation                            options and risks were discussed with the patient.                            All questions were answered, and informed consent                            was obtained. Prior Anticoagulants: The patient has                            taken Plavix (clopidogrel), last dose was 5 days                            prior to procedure. ASA Grade Assessment: III - A                            patient with severe systemic disease. After                            reviewing the risks and benefits, the patient was                            deemed in satisfactory condition to undergo the                            procedure.  After obtaining informed consent, the endoscope was                            passed under direct vision. Throughout the                            procedure, the patient's blood pressure, pulse, and                            oxygen saturations were monitored continuously. The                            Endoscope was introduced through the mouth, and                            advanced to the second part of duodenum. The upper                            GI  endoscopy was accomplished without difficulty.                            The patient tolerated the procedure well. Scope In: Scope Out: Findings:                 The Z-line was variable and was found at the                            gastroesophageal junction. 1-2 cm of variable z                            line. Biopsies were taken with a cold forceps for                            histology.                           The exam of the esophagus was otherwise normal.                           Localized moderate inflammation characterized by                            friability and granularity was found in the gastric                            body. Biopsies were taken with a cold forceps for                            histology.                           A small hiatal hernia was present.                           The exam of the stomach was  otherwise normal.                           The duodenal bulb and second portion of the                            duodenum were normal. Complications:            No immediate complications. Estimated Blood Loss:     Estimated blood loss was minimal. Impression:               - Z-line variable, at the gastroesophageal                            junction. Biopsied.                           - Gastritis. Biopsied.                           - Small hiatal hernia.                           - Normal duodenal bulb and second portion of the                            duodenum. Recommendation:           - Patient has a contact number available for                            emergencies. The signs and symptoms of potential                            delayed complications were discussed with the                            patient. Return to normal activities tomorrow.                            Written discharge instructions were provided to the                            patient.                           - Resume previous diet.                            - Continue present medications including                            pantoprazole 40 mg po qam long term.                           - Await pathology results.                           - Resume  Plavix (clopidogrel) at prior dose                            tomorrow. Refer to managing physician for further                            adjustment of therapy. Ladene Artist, MD 10/28/2016 10:23:23 AM This report has been signed electronically.

## 2016-10-28 NOTE — Progress Notes (Signed)
Called to room to assist during endoscopic procedure.  Patient ID and intended procedure confirmed with present staff. Received instructions for my participation in the procedure from the performing physician.  

## 2016-10-28 NOTE — Progress Notes (Signed)
Report to PACU, RN, vss, BBS= Clear.  

## 2016-10-29 ENCOUNTER — Ambulatory Visit: Payer: Medicare Other | Admitting: Gastroenterology

## 2016-10-29 ENCOUNTER — Telehealth: Payer: Self-pay | Admitting: *Deleted

## 2016-10-29 NOTE — Telephone Encounter (Signed)
  Follow up Call-  Call back number 10/28/2016  Post procedure Call Back phone  # [phone]  Permission to leave phone message Yes  Some recent data might be hidden     Patient questions:  Do you have a fever, pain , or abdominal swelling? No. Pain Score  0 *  Have you tolerated food without any problems? Yes.    Have you been able to return to your normal activities? Yes.    Do you have any questions about your discharge instructions: Diet   No. Medications  No. Follow up visit  No.  Do you have questions or concerns about your Care? No.  Actions: * If pain score is 4 or above: No action needed, pain <4.

## 2016-11-03 ENCOUNTER — Other Ambulatory Visit: Payer: Self-pay | Admitting: Family Medicine

## 2016-11-03 DIAGNOSIS — E785 Hyperlipidemia, unspecified: Secondary | ICD-10-CM

## 2016-11-04 NOTE — Telephone Encounter (Signed)
Is this okay to refill? 

## 2016-11-13 ENCOUNTER — Encounter: Payer: Self-pay | Admitting: Gastroenterology

## 2016-11-16 NOTE — Progress Notes (Signed)
Forward this information to his cardiologist

## 2016-11-18 DIAGNOSIS — I209 Angina pectoris, unspecified: Secondary | ICD-10-CM | POA: Diagnosis not present

## 2016-11-18 DIAGNOSIS — E785 Hyperlipidemia, unspecified: Secondary | ICD-10-CM | POA: Diagnosis not present

## 2016-11-18 DIAGNOSIS — Z8719 Personal history of other diseases of the digestive system: Secondary | ICD-10-CM | POA: Diagnosis not present

## 2016-11-18 DIAGNOSIS — Z8673 Personal history of transient ischemic attack (TIA), and cerebral infarction without residual deficits: Secondary | ICD-10-CM | POA: Diagnosis not present

## 2016-12-03 ENCOUNTER — Encounter: Payer: Self-pay | Admitting: Family Medicine

## 2016-12-04 ENCOUNTER — Encounter: Payer: Self-pay | Admitting: Family Medicine

## 2016-12-08 DIAGNOSIS — I209 Angina pectoris, unspecified: Secondary | ICD-10-CM | POA: Diagnosis not present

## 2016-12-14 DIAGNOSIS — I209 Angina pectoris, unspecified: Secondary | ICD-10-CM | POA: Diagnosis present

## 2016-12-14 NOTE — H&P (Addendum)
OFFICE VISIT NOTES COPIED TO EPIC FOR DOCUMENTATION  . History of Present Illness (April Garrison; 11/18/2016 3:28 PM) Patient words: Last O/V 10/05/2016; 6 week F/U for angina.  The patient is a 66 year old male who presents for a follow-up for Angina. Patient with history of stroke without residual defect, confirmed by MRI that occurred in 2015 and has been on Plavix since then. He has mild hyperlipidemia but does not tolerate atorvastatin or Crestor hence has been on fenofibrate acid.  He had been doing well until about 3 weeks ago he started noticing exertional chest tightness in the middle of the chest with radiation to the left shoulder every time he exerts. He exercises avidly on a daily basis Episodes last as long as he continues to exert but is immediately relieved with rest at 1-2 minutes. No other associated symptoms. States his symptoms have improves since last office visit 2 weeks ago, however he has no tpushed himself too much like he normally likes to during exercise as per my advice  This past Friday that he is on 09/18/2016 develop abdominal discomfort, nausea and threw up coffee ground emesis. He also had melena the same day. He is now scheduled for EGD by Dr. Erasmo Score start. He has not had any recurrence of abdominal discomfort or coffee-ground emesis or dark stools since last office visit. He is now off of Plavix and is presently on PPI.  Denies symptoms of recurrence of TIA, no symptoms of claudication. He does not smoke. Does not abuse alcohol.   Problem List/Past Medical (April Garrison; 11/18/2016 3:26 PM) Angina pectoris (I20.9)  Exercise myoview stress 09/28/2016: 1. Resting EKG demonstrates normal sinus rhythm, normal axis. No evidence of ischemia. Stress EKG is strongly positive for myocardial ischemia. Patient had upsloping ST depression at peak exercise however there was horizontal ST depression or T-wave inversion noted in inferior and lateral leads at 1 minute into  recovery that persisted for greater than 4 minutes. Patient exercised on Bruce protocol for 13:15 minutes and achieved 14.3 to METS. Stress test terminated due to 92% MPHR achieved (Target HR >85%). Symptoms included chest pain and left arm discomfort. Normal blood pressure response. 2. The perfusion imaging study demonstrates the left ventricle to be mildly dilated both in rest and stress images. Left ventricular end-diastolic volume was 341 mL. There is a moderate to large sized severe ischemia involving the mid to distal anterior, anteroseptal, anteroapical and inferoapical region. Left ventricle systolic function calculated by QGS was 45% with hypokinesis involving the same region. This represents a high risk nuclear perfusion study, however patient has excellent exercise tolerance and places him overall a Echocardiogram 09/23/2016: Left ventricle cavity is normal in size. Moderate concentric hypertrophy of the left ventricle. Normal global wall motion. Doppler evidence of grade II (pseudonormal) diastolic dysfunction. Diastolic dysfunction findings suggests elevated LA/LV end diastolic pressure. Calculated EF 66%. Left atrial cavity is severely dilated at 5.1 cm. Aneurysmal interatrial septum without PFO. Right atrial cavity is moderately dilated. Trileaflet aortic valve with mild to moderate regurgitation. Miotral valve apperas normal. There is mild to moderate regurgitation. Mild to moderate tricuspid regurgitation. Mild to moderate pulmonary hypertension. Pulmonary artery systolic pressure is estimated at 40 mm Hg. IVC is dilated with respiratory variation. Suggests mild elevation in right hear Acute upper GI bleed (K92.2)  Mild hyperlipidemia (E78.5)  Labwork  Labs 09/22/2016: RBC 3.14, hemoglobin 9.3, hematocrit 28.1, no other indices, CBC normal. Potassium 4.0, creatinine 0.98, CMP normal. Cholesterol 154, triglycerides 190, HDL 35,  LDL 80. Labs 09/22/2016: HB 9.0, same as 09/21/2016. Labs 12/16/2015:  Total cholesterol 149, triglycerides 66, HDL 41, LDL 95. BUN 27, creatinine 0.95, CMP otherwise normal. HB 13.0/HCT 38.6, weight is 198. Right carotid bruit (R09.89)  History of CVA (cerebrovascular accident) (Z86.73) 11/2013 11/2013 stroke with right ssided weakness: MRI brain 01/24/2014: Left basal ganglia periventricular white matter disease related to remote infarct or a similar to ischemia. Carotid duplex 01/24/2014: Minimal amount of bilateral intimal wall thickening right greater than the left.  Allergies (April Garrison; 2016-12-13 3:26 PM) No Known Drug Allergies 09/22/2016  Family History (April Garrison; 2016/12/13 3:26 PM) Mother  Deceased. at age 50 from Natural Causes; No known Heart conditions Father  Deceased. in his 21's from Natural Causes; No known Heart conditions Sister 3  Older; No known Heart conditions, 1-Deceased Brother 2  1-Deceased; 1-Younger; No known Heart conditions  Social History (April Garrison; Dec 13, 2016 3:26 PM) Current tobacco use  Former smoker. As a Teenager Non Drinker/No Alcohol Use  Marital status  Married. Number of Children  2. Living Situation  Lives with spouse.  Past Surgical History (April Garrison; 12-13-2016 3:26 PM) None 09/22/2016  Medication History (April Garrison; 12-13-16 3:41 PM) Pantoprazole Sodium (40MG  Tablet DR, 1 (one) Tablet Oral once daily 30 minutes before meal, Taken starting 11/04/2016) Active. AmLODIPine Besylate (5MG  Tablet, 1 (one) Tablet Oral every evening after dinner, Taken starting 11/04/2016) Active. Metoprolol Succinate ER (25MG  Tablet ER 24HR, 1 (one) Tablet Oral daily, Taken starting 10/05/2016) Active. Nitrostat (0.4MG  Tab Sublingual, 1 (one) Tablet Tablet Sublingual every 5 minutes as needed for chest pain., Taken starting 09/22/2016) Active. Fenofibrate (54MG  Tablet, 1 Oral daily) Active. Aspirin EC (81MG  Tablet DR, 1 Oral daily) Active. CoQ10 (1 Oral daily) Specific dose unknown -  Active. Multiple Vitamin (1 (one) Oral daily) Active. B Complex Formula 1 (1 Oral daily) Active. Vitamin D (1 Oral daily) Specific dose unknown - Active. Medications Reconciled (Verbally)  Diagnostic Studies History (April Louretta Shorten; 2016/12/13 3:39 PM) Carotid Doppler 01/24/2014 Minimal amount of bilateral intimal wall thickening right greater than the left. Colonoscopy 2016 Normal. Endoscopy 10/2016 Normal. Chronic Gastristis; Acid Reflux Chest X-ray 09/22/2016 Normal. Echocardiogram 09/23/2016 Left ventricle cavity is normal in size. Moderate concentric hypertrophy of the left ventricle. Normal global wall motion. Doppler evidence of grade II (pseudonormal) diastolic dysfunction. Diastolic dysfunction findings suggests elevated LA/LV end diastolic pressure. Calculated EF 66%. Left atrial cavity is severely dilated at 5.1 cm. Aneurysmal interatrial septum without PFO. Right atrial cavity is moderately dilated. Trileaflet aortic valve with mild to moderate regurgitation. Miotral valve apperas normal. There is mild to moderate regurgitation. Mild to moderate tricuspid regurgitation. Mild to moderate pulmonary hypertension. Pulmonary artery systolic pressure is estimated at 40 mm Hg. IVC is dilated with respiratory variation. Suggests mild elevation in right heart pressure. Nuclear stress test 09/28/2016 1. Resting EKG demonstrates normal sinus rhythm, normal axis. No evidence of ischemia. Stress EKG is strongly positive for myocardial ischemia. Patient had upsloping ST depression at peak exercise however there was horizontal ST depression or T-wave inversion noted in inferior and lateral leads at 1 minute into recovery that persisted for greater than 4 minutes. Patient exercised on Bruce protocol for 13:15 minutes and achieved 14.3 to METS. Stress test terminated due to 92% MPHR achieved (Target HR >85%). Symptoms included chest pain and left arm discomfort. Normal blood pressure response. 2. The  perfusion imaging study demonstrates the left ventricle to be mildly dilated both in rest and stress images. Left ventricular end-diastolic  volume was 184 mL. There is a moderate to large sized severe ischemia involving the mid to distal anterior, anteroseptal, anteroapical and inferoapical region. Left ventricle systolic function calculated by QGS was 45% with hypokinesis involving the same region. This represents a high risk nuclear perfusion study, however patient has excellent exercise tolerance and places him overall a    Review of Systems Gwinda Maine, FNP-C; 11/18/2016 5:0 PM) General Not Present- Appetite Loss and Weight Gain. Respiratory Not Present- Chronic Cough and Wakes up from Sleep Wheezing or Short of Breath. Cardiovascular Present- Chest Pain (improved since treated with medications). Not Present- Claudications, Hypertension and Shortness of Breath. Gastrointestinal Not Present- Black, Tarry Stool, Bloody Stool, Difficulty Swallowing and Hematemesis. Musculoskeletal Not Present- Decreased Range of Motion and Muscle Atrophy. Neurological Not Present- Attention Deficit. Psychiatric Not Present- Personality Changes and Suicidal Ideation. Endocrine Not Present- Cold Intolerance and Heat Intolerance. Hematology Not Present- Abnormal Bleeding. All other systems negative  Vitals (April Garrison; 11/18/2016 3:43 PM) 11/18/2016 3:29 PM Weight: 175.38 lb Height: 68in Body Surface Area: 1.93 m Body Mass Index: 26.67 kg/m  Pulse: 48 (Regular)  P.OX: 99% (Room air) BP: 146/76 (Sitting, Left Arm, Standard)       Physical Exam Gwinda Maine FNP-C; 11/18/2016 4:59 PM) General Mental Status-Alert. General Appearance-Cooperative and Appears stated age. Build & Nutrition-Well nourished and Moderately built.  Head and Neck Thyroid Gland Characteristics - normal size and consistency and no palpable nodules.  Chest and Lung Exam Chest and lung exam reveals  -quiet, even and easy respiratory effort with no use of accessory muscles, non-tender and on auscultation, normal breath sounds, no adventitious sounds.  Cardiovascular Cardiovascular examination reveals -abdominal aorta auscultation reveals no bruits and no prominent pulsation, femoral artery auscultation bilaterally reveals normal pulses, no bruits, no thrills and normal pedal pulses bilaterally. Auscultation Rhythm - Regular. Heart Sounds - Normal heart sounds. Murmurs & Other Heart Sounds: Murmur: Murmur 2 - Location - Sternal Border - Left. Timing - Early diastolic. Grade - I/VI. Location - Apex and Sternal Border - Left. Timing - Mid-systolic. Grade - II/VI.  Abdomen Palpation/Percussion Normal exam - Non Tender and No hepatosplenomegaly.  Peripheral Vascular Lower Extremity Inspection - Bilateral - Varicose veins. Carotid arteries - Left-No Carotid bruit. Carotid arteries - Right-Harsh Bruit.  Neurologic Neurologic evaluation reveals -alert and oriented x 3 with no impairment of recent or remote memory. Motor-Grossly intact without any focal deficits.  Musculoskeletal Global Assessment Left Lower Extremity - no deformities, masses or tenderness, no known fractures. Right Lower Extremity - no deformities, masses or tenderness, no known fractures.    Assessment & Plan Gwinda Maine FNP-C; 11/18/2016 5:00 PM) Angina pectoris (I20.9) Story: Exercise myoview stress 09/28/2016: 1. Resting EKG demonstrates normal sinus rhythm, normal axis. No evidence of ischemia. Stress EKG is strongly positive for myocardial ischemia. Patient had upsloping ST depression at peak exercise however there was horizontal ST depression or T-wave inversion noted in inferior and lateral leads at 1 minute into recovery that persisted for greater than 4 minutes. Patient exercised on Bruce protocol for 13:15 minutes and achieved 14.3 to METS. Stress test terminated due to 92% MPHR achieved (Target  HR >85%). Symptoms included chest pain and left arm discomfort. Normal blood pressure response. 2. The perfusion imaging study demonstrates the left ventricle to be mildly dilated both in rest and stress images. Left ventricular end-diastolic volume was 546 mL. There is a moderate to large sized severe ischemia involving the mid to distal anterior,  anteroseptal, anteroapical and inferoapical region. Left ventricle systolic function calculated by QGS was 45% with hypokinesis involving the same region. This represents a high risk nuclear perfusion study, however patient has excellent exercise tolerance and places him overall a  Echocardiogram 09/23/2016: Left ventricle cavity is normal in size. Moderate concentric hypertrophy of the left ventricle. Normal global wall motion. Doppler evidence of grade II (pseudonormal) diastolic dysfunction. Diastolic dysfunction findings suggests elevated LA/LV end diastolic pressure. Calculated EF 66%. Left atrial cavity is severely dilated at 5.1 cm. Aneurysmal interatrial septum without PFO. Right atrial cavity is moderately dilated. Trileaflet aortic valve with mild to moderate regurgitation. Miotral valve apperas normal. There is mild to moderate regurgitation. Mild to moderate tricuspid regurgitation. Mild to moderate pulmonary hypertension. Pulmonary artery systolic pressure is estimated at 40 mm Hg. IVC is dilated with respiratory variation. Suggests mild elevation in right hear Impression: EKG 09/22/2016: Sinus bradycardia at rate of 59 bpm, normal axis. No evidence of ischemia, normal EKG. History of GI bleed (Z87.19) Story: chronic gastritis and acid reflux changes History of CVA (cerebrovascular accident) (C62.37) Story: 11/2013 stroke with right ssided weakness: MRI brain 01/24/2014: Left basal ganglia periventricular white matter disease related to remote infarct or a similar to ischemia.  Carotid duplex 01/24/2014: Minimal amount of bilateral intimal  wall thickening right greater than the left. Current Plans Discontinued Clopidogrel Bisulfate 75MG . Mild hyperlipidemia (E78.5) Right carotid bruit (R09.89) Labwork Story:  12/09/2016: Creatinine 0.94, Potassium 4.0, BMP normal. Hgb 12.6, Hct normal at 38.4, CBC normal. INR 1.0, PT 11.0. Labs 09/22/2016: RBC 3.14, hemoglobin 9.3, hematocrit 28.1, no other indices, CBC normal. Potassium 4.0, creatinine 0.98, CMP normal. Cholesterol 154, triglycerides 190, HDL 35, LDL 80.  Labs 09/22/2016: HB 9.0, same as 09/21/2016.  Labs 12/16/2015: Total cholesterol 149, triglycerides 66, HDL 41, LDL 95. BUN 27, creatinine 0.95, CMP otherwise normal. HB 13.0/HCT 38.6, weight is 198. Current Plans Mechanism of underlying disease process and action of medications discussed with the patient. I also discussed primary/secondary prevention and dietary counseling was done.The patient presents for evaluation of angina. Patient was recently seen for complaints of angina and abnormal nuclear stress test. It was felt that he would possibly need further cardiac workup, however, he had also during the interim developed a GI bleed and was being worked up by GI. It was felt that he should be further evaluated by GI first and then be reevaluated by [place]. He reports undergoing endoscopy that showed chronic gastritis and acid reflux changes. He is currently on Protonix daily and has had no further GI bleed. He reports complete resolution of chest pain angina symptoms. He has returned to running 10-15 miles a day without exertional chest pain. Appropriate medical therapy. Further discussion with Dr. Einar Gip, given his abnormal stress test, he would likely benefit from coronary angiogram to evaluate for LAD stenosis and collateral circulation. Schedule for cardiac catheterization, and possible angioplasty. We discussed regarding risks, benefits, alternatives to this including stress testing, CTA and continued medical therapy. Patient wants to  proceed. Understands <1-2% risk of death, stroke, MI, urgent CABG, bleeding, infection, renal failure but not limited to these. I will have him follow up after the procedure for further evaluation.  CC: Jill Alexanders, MD  Signed electronically by Gwinda Maine, FNP-C (11/18/2016 5:01 PM)

## 2016-12-15 ENCOUNTER — Encounter (HOSPITAL_COMMUNITY): Payer: Self-pay | Admitting: General Practice

## 2016-12-15 ENCOUNTER — Observation Stay (HOSPITAL_COMMUNITY)
Admission: RE | Admit: 2016-12-15 | Discharge: 2016-12-16 | Disposition: A | Discharge status: Home / Self Care | Payer: Medicare Other | Source: Ambulatory Visit | Attending: Cardiology | Admitting: Cardiology

## 2016-12-15 ENCOUNTER — Encounter (HOSPITAL_COMMUNITY)
Admission: RE | Disposition: A | Discharge status: Home / Self Care | Payer: Self-pay | Source: Ambulatory Visit | Attending: Cardiology

## 2016-12-15 DIAGNOSIS — Z8719 Personal history of other diseases of the digestive system: Secondary | ICD-10-CM | POA: Diagnosis not present

## 2016-12-15 DIAGNOSIS — I2582 Chronic total occlusion of coronary artery: Secondary | ICD-10-CM | POA: Insufficient documentation

## 2016-12-15 DIAGNOSIS — Z9861 Coronary angioplasty status: Secondary | ICD-10-CM

## 2016-12-15 DIAGNOSIS — K254 Chronic or unspecified gastric ulcer with hemorrhage: Secondary | ICD-10-CM | POA: Diagnosis not present

## 2016-12-15 DIAGNOSIS — R9439 Abnormal result of other cardiovascular function study: Secondary | ICD-10-CM | POA: Diagnosis not present

## 2016-12-15 DIAGNOSIS — I69351 Hemiplegia and hemiparesis following cerebral infarction affecting right dominant side: Secondary | ICD-10-CM | POA: Insufficient documentation

## 2016-12-15 DIAGNOSIS — Z7982 Long term (current) use of aspirin: Secondary | ICD-10-CM | POA: Insufficient documentation

## 2016-12-15 DIAGNOSIS — E785 Hyperlipidemia, unspecified: Secondary | ICD-10-CM | POA: Insufficient documentation

## 2016-12-15 DIAGNOSIS — I25119 Atherosclerotic heart disease of native coronary artery with unspecified angina pectoris: Secondary | ICD-10-CM | POA: Diagnosis not present

## 2016-12-15 DIAGNOSIS — K219 Gastro-esophageal reflux disease without esophagitis: Secondary | ICD-10-CM | POA: Diagnosis not present

## 2016-12-15 DIAGNOSIS — I25708 Atherosclerosis of coronary artery bypass graft(s), unspecified, with other forms of angina pectoris: Secondary | ICD-10-CM | POA: Diagnosis not present

## 2016-12-15 DIAGNOSIS — K2950 Unspecified chronic gastritis without bleeding: Secondary | ICD-10-CM | POA: Diagnosis not present

## 2016-12-15 DIAGNOSIS — Z87891 Personal history of nicotine dependence: Secondary | ICD-10-CM | POA: Insufficient documentation

## 2016-12-15 DIAGNOSIS — I10 Essential (primary) hypertension: Secondary | ICD-10-CM | POA: Insufficient documentation

## 2016-12-15 DIAGNOSIS — I209 Angina pectoris, unspecified: Secondary | ICD-10-CM | POA: Diagnosis present

## 2016-12-15 HISTORY — PX: LEFT HEART CATH AND CORONARY ANGIOGRAPHY: CATH118249

## 2016-12-15 HISTORY — PX: CORONARY STENT INTERVENTION: CATH118234

## 2016-12-15 HISTORY — DX: Angina pectoris, unspecified: I20.9

## 2016-12-15 HISTORY — DX: Atherosclerotic heart disease of native coronary artery without angina pectoris: I25.10

## 2016-12-15 LAB — POCT ACTIVATED CLOTTING TIME
ACTIVATED CLOTTING TIME: 213 s
ACTIVATED CLOTTING TIME: 230 s
ACTIVATED CLOTTING TIME: 225 s

## 2016-12-15 SURGERY — LEFT HEART CATH AND CORONARY ANGIOGRAPHY
Anesthesia: LOCAL

## 2016-12-15 MED ORDER — SODIUM CHLORIDE 0.9% FLUSH
3.0000 mL | Freq: Two times a day (BID) | INTRAVENOUS | Status: DC
Start: 2016-12-16 — End: 2016-12-16
  Administered 2016-12-15 – 2016-12-16 (×2): 3 mL via INTRAVENOUS

## 2016-12-15 MED ORDER — METOPROLOL SUCCINATE ER 25 MG PO TB24
25.0000 mg | ORAL_TABLET | Freq: Every day | ORAL | Status: DC
  Administered 2016-12-15 – 2016-12-16 (×2): 25 mg via ORAL
  Filled 2016-12-15 (×2): qty 1

## 2016-12-15 MED ORDER — ACETAMINOPHEN 325 MG PO TABS: 650.0000 mg | ORAL_TABLET | ORAL | Status: DC | PRN

## 2016-12-15 MED ORDER — AMLODIPINE BESYLATE 5 MG PO TABS
5.0000 mg | ORAL_TABLET | Freq: Every day | ORAL | Status: DC
  Administered 2016-12-15: 5 mg via ORAL
  Filled 2016-12-15: qty 1

## 2016-12-15 MED ORDER — SODIUM CHLORIDE 0.9 % WEIGHT BASED INFUSION: 1.0000 mL/kg/h | INTRAVENOUS | Status: DC

## 2016-12-15 MED ORDER — CLOPIDOGREL BISULFATE 75 MG PO TABS: 75.0000 mg | ORAL_TABLET | Freq: Every day | ORAL | Status: DC

## 2016-12-15 MED ORDER — VERAPAMIL HCL 2.5 MG/ML IV SOLN
INTRA_ARTERIAL | Status: DC | PRN
  Administered 2016-12-15: 15 mL via INTRA_ARTERIAL

## 2016-12-15 MED ORDER — IOPAMIDOL (ISOVUE-370) INJECTION 76%
INTRAVENOUS | Status: AC
  Filled 2016-12-15: qty 100

## 2016-12-15 MED ORDER — SODIUM CHLORIDE 0.9 % IV SOLN: 250.0000 mL | INTRAVENOUS | Status: DC | PRN

## 2016-12-15 MED ORDER — CLOPIDOGREL BISULFATE 75 MG PO TABS
ORAL_TABLET | ORAL | Status: AC
  Filled 2016-12-15: qty 1

## 2016-12-15 MED ORDER — HEPARIN SODIUM (PORCINE) 1000 UNIT/ML IJ SOLN
INTRAMUSCULAR | Status: AC
  Filled 2016-12-15: qty 1

## 2016-12-15 MED ORDER — IOPAMIDOL (ISOVUE-370) INJECTION 76%
INTRAVENOUS | Status: AC
  Filled 2016-12-15: qty 50

## 2016-12-15 MED ORDER — VERAPAMIL HCL 2.5 MG/ML IV SOLN
INTRAVENOUS | Status: AC
  Filled 2016-12-15: qty 2

## 2016-12-15 MED ORDER — SODIUM CHLORIDE 0.9 % WEIGHT BASED INFUSION
3.0000 mL/kg/h | INTRAVENOUS | Status: DC
  Administered 2016-12-15: 3 mL/kg/h via INTRAVENOUS

## 2016-12-15 MED ORDER — HYDROMORPHONE HCL 1 MG/ML IJ SOLN
INTRAMUSCULAR | Status: AC
  Filled 2016-12-15: qty 1

## 2016-12-15 MED ORDER — HEPARIN SODIUM (PORCINE) 1000 UNIT/ML IJ SOLN
INTRAMUSCULAR | Status: DC | PRN
  Administered 2016-12-15: 3000 [IU] via INTRAVENOUS
  Administered 2016-12-15: 5000 [IU] via INTRAVENOUS
  Administered 2016-12-15 (×2): 2000 [IU] via INTRAVENOUS
  Administered 2016-12-15: 3000 [IU] via INTRAVENOUS

## 2016-12-15 MED ORDER — SODIUM CHLORIDE 0.9% FLUSH: 3.0000 mL | INTRAVENOUS | Status: DC | PRN

## 2016-12-15 MED ORDER — HEPARIN (PORCINE) IN NACL 2-0.9 UNIT/ML-% IJ SOLN
INTRAMUSCULAR | Status: DC | PRN
  Administered 2016-12-15: 1000 mL

## 2016-12-15 MED ORDER — CLOPIDOGREL BISULFATE 75 MG PO TABS
75.0000 mg | ORAL_TABLET | Freq: Once | ORAL | Status: AC
  Administered 2016-12-15: 75 mg via ORAL

## 2016-12-15 MED ORDER — NITROGLYCERIN 0.4 MG SL SUBL: 0.4000 mg | SUBLINGUAL_TABLET | SUBLINGUAL | Status: DC | PRN

## 2016-12-15 MED ORDER — HYDROMORPHONE HCL 1 MG/ML IJ SOLN
INTRAMUSCULAR | Status: DC | PRN
  Administered 2016-12-15: 0.5 mg via INTRAVENOUS

## 2016-12-15 MED ORDER — NITROGLYCERIN 1 MG/10 ML FOR IR/CATH LAB
INTRA_ARTERIAL | Status: AC
  Filled 2016-12-15: qty 10

## 2016-12-15 MED ORDER — FENOFIBRATE 54 MG PO TABS
54.0000 mg | ORAL_TABLET | Freq: Every day | ORAL | Status: DC
  Administered 2016-12-15 – 2016-12-16 (×2): 54 mg via ORAL
  Filled 2016-12-15 (×2): qty 1

## 2016-12-15 MED ORDER — IOPAMIDOL (ISOVUE-370) INJECTION 76%
INTRAVENOUS | Status: DC | PRN
  Administered 2016-12-15: 225 mL via INTRA_ARTERIAL

## 2016-12-15 MED ORDER — ASPIRIN EC 81 MG PO TBEC
81.0000 mg | DELAYED_RELEASE_TABLET | Freq: Every day | ORAL | Status: DC
  Administered 2016-12-16: 09:00:00 81 mg via ORAL
  Filled 2016-12-15: qty 1

## 2016-12-15 MED ORDER — ASPIRIN 81 MG PO CHEW
CHEWABLE_TABLET | ORAL | Status: AC
  Administered 2016-12-15: 81 mg
  Filled 2016-12-15: qty 1

## 2016-12-15 MED ORDER — ADULT MULTIVITAMIN W/MINERALS CH
1.0000 | ORAL_TABLET | Freq: Every day | ORAL | Status: DC
  Administered 2016-12-16: 1 via ORAL
  Filled 2016-12-15: qty 1

## 2016-12-15 MED ORDER — ONDANSETRON HCL 4 MG/2ML IJ SOLN: 4.0000 mg | Freq: Four times a day (QID) | INTRAMUSCULAR | Status: DC | PRN

## 2016-12-15 MED ORDER — ASPIRIN 81 MG PO TABS
81.0000 mg | ORAL_TABLET | Freq: Every day | ORAL | Status: DC
Start: 2016-12-15 — End: 2016-12-15

## 2016-12-15 MED ORDER — ASPIRIN 81 MG PO CHEW: 81.0000 mg | CHEWABLE_TABLET | ORAL | Status: DC

## 2016-12-15 MED ORDER — PANTOPRAZOLE SODIUM 40 MG PO TBEC
40.0000 mg | DELAYED_RELEASE_TABLET | Freq: Every day | ORAL | Status: DC
  Administered 2016-12-16: 40 mg via ORAL
  Filled 2016-12-15: qty 1

## 2016-12-15 MED ORDER — LIDOCAINE HCL 1 % IJ SOLN
INTRAMUSCULAR | Status: AC
  Filled 2016-12-15: qty 20

## 2016-12-15 MED ORDER — MULTI-VITAMIN/MINERALS PO TABS: 1.0000 | ORAL_TABLET | Freq: Every day | ORAL | Status: DC

## 2016-12-15 MED ORDER — CO Q-10 30 MG PO CAPS: ORAL_CAPSULE | Freq: Every day | ORAL | Status: DC

## 2016-12-15 MED ORDER — MIDAZOLAM HCL 2 MG/2ML IJ SOLN
INTRAMUSCULAR | Status: AC
  Filled 2016-12-15: qty 2

## 2016-12-15 MED ORDER — MIDAZOLAM HCL 2 MG/2ML IJ SOLN
INTRAMUSCULAR | Status: DC | PRN
  Administered 2016-12-15: 2 mg via INTRAVENOUS

## 2016-12-15 MED ORDER — SODIUM CHLORIDE 0.9% FLUSH: 3.0000 mL | Freq: Two times a day (BID) | INTRAVENOUS | Status: DC

## 2016-12-15 MED ORDER — LIDOCAINE HCL (PF) 1 % IJ SOLN
INTRAMUSCULAR | Status: DC | PRN
  Administered 2016-12-15: 2 mL

## 2016-12-15 MED ORDER — NITROGLYCERIN 1 MG/10 ML FOR IR/CATH LAB
INTRA_ARTERIAL | Status: DC | PRN
  Administered 2016-12-15: 200 ug via INTRACORONARY

## 2016-12-15 SURGICAL SUPPLY — 25 items
BALLN EMERGE MR 2.5X15 (BALLOONS) ×2
BALLN EMERGE MR PUSH 1.5X20 (BALLOONS) ×2
BALLN ~~LOC~~ MOZEC 3.0X13 (BALLOONS) ×2
BALLOON EMERGE MR 2.5X15 (BALLOONS) ×1 IMPLANT
BALLOON EMERGE MR PUSH 1.5X20 (BALLOONS) ×1 IMPLANT
BALLOON ~~LOC~~ MOZEC 3.0X13 (BALLOONS) ×1 IMPLANT
CATH LAUNCHER 6FR AL1 (CATHETERS) ×1 IMPLANT
CATH MICROGUIDE FINCRSS 150 CM (MICROCATHETER) ×1 IMPLANT
CATH OPTITORQUE TIG 4.0 5F (CATHETERS) ×2 IMPLANT
CATH VISTA GUIDE 6FR XB4 (CATHETERS) ×2 IMPLANT
CATHETER LAUNCHER 6FR AL1 (CATHETERS) ×2
DEVICE RAD TR BAND REGULAR (VASCULAR PRODUCTS) ×2 IMPLANT
GLIDESHEATH SLEND A-KIT 6F 20G (SHEATH) ×2 IMPLANT
GUIDEWIRE INQWIRE 1.5J.035X260 (WIRE) ×1 IMPLANT
INQWIRE 1.5J .035X260CM (WIRE) ×2
KIT ENCORE 26 ADVANTAGE (KITS) ×2 IMPLANT
KIT HEART LEFT (KITS) ×2 IMPLANT
MICROGUIDE FINECROSS 150 CM (MICROCATHETER) ×2
PACK CARDIAC CATHETERIZATION (CUSTOM PROCEDURE TRAY) ×2 IMPLANT
STENT SYNERGY DES 3X20 (Permanent Stent) ×2 IMPLANT
TRANSDUCER W/STOPCOCK (MISCELLANEOUS) ×2 IMPLANT
TUBING CIL FLEX 10 FLL-RA (TUBING) ×2 IMPLANT
WIRE ASAHI MIRACLEBROS12 180CM (WIRE) ×2 IMPLANT
WIRE COUGAR XT STRL 190CM (WIRE) ×2 IMPLANT
WIRE HI TORQ WHISPER MS 190CM (WIRE) ×2 IMPLANT

## 2016-12-15 NOTE — Interval H&P Note (Signed)
History and Physical Interval Note:  12/15/2016 12:27 PM  Jose Dyer  has presented today for surgery, with the diagnosis of cp  The various methods of treatment have been discussed with the patient and family. After consideration of risks, benefits and other options for treatment, the patient has consented to  Procedure(s): Left Heart Cath and Coronary Angiography (N/A) and possible PCI as a surgical intervention .  The patient's history has been reviewed, patient examined, no change in status, stable for surgery.  I have reviewed the patient's chart and labs.  Questions were answered to the patient's satisfaction.    Ischemic Symptoms? Asymptomatic (No ischemic symptoms) Anti-ischemic Medical Therapy? Maximal Medical Therapy (2 or more classes of medications) Non-invasive Test Results? High-risk stress test findings: cardiac mortality >3%/yr Prior CABG? No Previous CABG   Patient Information:   1-2V CAD, no prox LAD  A (7)  Indication: 19; Score: 7   Patient Information:   CTO of 1 vessel, no other CAD  U (5)  Indication: 29; Score: 5   Patient Information:   1V CAD with prox LAD  A (7)  Indication: 35; Score: 7   Patient Information:   2V-CAD with prox LAD  A (8)  Indication: 41; Score: 8   Patient Information:   3V-CAD without LMCA  A (8)  Indication: 47; Score: 8   Patient Information:   3V-CAD without LMCA With Abnormal LV systolic function  A (8)  Indication: 48; Score: 8   Patient Information:   LMCA-CAD  A (9)  Indication: 49; Score: 9   Patient Information:   2V-CAD with prox LAD PCI  A (7)  Indication: 62; Score: 7   Patient Information:   2V-CAD with prox LAD CABG  A (8)  Indication: 62; Score: 8   Patient Information:   3V-CAD without LMCA With Low CAD burden(i.e., 3 focal stenoses, low SYNTAX score) PCI  A (7)  Indication: 63; Score: 7   Patient Information:   3V-CAD without LMCA With Low CAD burden(i.e., 3  focal stenoses, low SYNTAX score) CABG  A (9)  Indication: 63; Score: 9   Patient Information:   3V-CAD without LMCA E06c - Intermediate-high CAD burden (i.e., multiple diffuse lesions, presence of CTO, or high SYNTAX score) PCI  U (4)  Indication: 64; Score: 4   Patient Information:   3V-CAD without LMCA E06c - Intermediate-high CAD burden (i.e., multiple diffuse lesions, presence of CTO, or high SYNTAX score) CABG  A (9)  Indication: 64; Score: 9   Patient Information:   LMCA-CAD With Isolated LMCA stenosis  PCI  U (6)  Indication: 65; Score: 6   Patient Information:   LMCA-CAD With Isolated LMCA stenosis  CABG  A (9)  Indication: 65; Score: 9   Patient Information:   LMCA-CAD Additional CAD, low CAD burden (i.e., 1- to 2-vessel additional involvement, low SYNTAX score) PCI  U (5)  Indication: 66; Score: 5   Patient Information:   LMCA-CAD Additional CAD, low CAD burden (i.e., 1- to 2-vessel additional involvement, low SYNTAX score) CABG  A (9)  Indication: 66; Score: 9   Patient Information:   LMCA-CAD Additional CAD, intermediate-high CAD burden (i.e., 3-vessel involvement, presence of CTO, or high SYNTAX score) PCI  I (3)  Indication: 67; Score: 3   Patient Information:   LMCA-CAD Additional CAD, intermediate-high CAD burden (i.e., 3-vessel involvement, presence of CTO, or high SYNTAX score) CABG  A (9)  Indication: 67; Score: 9 Jose Dyer

## 2016-12-15 NOTE — Care Management Note (Addendum)
Case Management Note  Patient Details  Name: Firas Guardado MRN: [id number] Date of Birth: [birth year]  Subjective/Objective:  From home with wife,   s/p  Unsuccessful attempt at CTO proximal LAD. Successful PTCA and stenting of the distal circumflex, will be on plavix and asa.         5/2 Tomi Bamberger RN, BSN - patient was changed to effient (prasugrel) 10mg  daily,  Per benefit check for prasugrel co pay for 30 days is 45.00 and for a 90 day supply co pay is 125.00.  There is no prior auth needed.  NCM called patient at [phone] and left this information for him.        Action/Plan: PTA indep, NCM will cont to follow for dc needs.  Expected Discharge Date:                  Expected Discharge Plan:  Home/Self Care  In-House Referral:     Discharge planning Services  CM Consult  Post Acute Care Choice:    Choice offered to:     DME Arranged:    DME Agency:     HH Arranged:    HH Agency:     Status of Service:  In process, will continue to follow  If discussed at Long Length of Stay Meetings, dates discussed:    Additional Comments:  Zenon Mayo, RN 12/15/2016, 3:05 PM

## 2016-12-15 NOTE — Care Management Obs Status (Signed)
[facility] NOTIFICATION   Patient Details  Name: Ramello Cordial MRN: [id number] Date of Birth: Jan 12, 1951   Medicare Observation Status Notification Given:  Yes    Zenon Mayo, RN 12/15/2016, 3:25 PM

## 2016-12-15 NOTE — Progress Notes (Signed)
TR BAND REMOVAL  LOCATION:    right radial  DEFLATED PER PROTOCOL:    Yes.    TIME BAND OFF / DRESSING APPLIED:    1800   SITE UPON ARRIVAL:    Level 0  SITE AFTER BAND REMOVAL:    Level 0  CIRCULATION SENSATION AND MOVEMENT:    Within Normal Limits   Yes.    COMMENTS:   Tolerated procedure well 

## 2016-12-16 ENCOUNTER — Encounter (HOSPITAL_COMMUNITY): Payer: Self-pay | Admitting: Cardiology

## 2016-12-16 DIAGNOSIS — I10 Essential (primary) hypertension: Secondary | ICD-10-CM | POA: Diagnosis not present

## 2016-12-16 DIAGNOSIS — K254 Chronic or unspecified gastric ulcer with hemorrhage: Secondary | ICD-10-CM | POA: Diagnosis not present

## 2016-12-16 DIAGNOSIS — K219 Gastro-esophageal reflux disease without esophagitis: Secondary | ICD-10-CM | POA: Diagnosis not present

## 2016-12-16 DIAGNOSIS — K2950 Unspecified chronic gastritis without bleeding: Secondary | ICD-10-CM | POA: Diagnosis not present

## 2016-12-16 DIAGNOSIS — Z7982 Long term (current) use of aspirin: Secondary | ICD-10-CM | POA: Diagnosis not present

## 2016-12-16 DIAGNOSIS — I25119 Atherosclerotic heart disease of native coronary artery with unspecified angina pectoris: Secondary | ICD-10-CM | POA: Diagnosis not present

## 2016-12-16 DIAGNOSIS — I69351 Hemiplegia and hemiparesis following cerebral infarction affecting right dominant side: Secondary | ICD-10-CM | POA: Diagnosis not present

## 2016-12-16 DIAGNOSIS — Z87891 Personal history of nicotine dependence: Secondary | ICD-10-CM | POA: Diagnosis not present

## 2016-12-16 DIAGNOSIS — E785 Hyperlipidemia, unspecified: Secondary | ICD-10-CM | POA: Diagnosis not present

## 2016-12-16 DIAGNOSIS — I2582 Chronic total occlusion of coronary artery: Secondary | ICD-10-CM | POA: Diagnosis not present

## 2016-12-16 DIAGNOSIS — Z8719 Personal history of other diseases of the digestive system: Secondary | ICD-10-CM | POA: Diagnosis not present

## 2016-12-16 LAB — CBC
HCT: 36.1 % — ABNORMAL LOW (ref 39.0–52.0)
Hemoglobin: 11.5 g/dL — ABNORMAL LOW (ref 13.0–17.0)
MCH: 26.7 pg (ref 26.0–34.0)
MCHC: 31.9 g/dL (ref 30.0–36.0)
MCV: 84 fL (ref 78.0–100.0)
PLATELETS: 171 10*3/uL (ref 150–400)
RBC: 4.3 MIL/uL (ref 4.22–5.81)
RDW: 13.7 % (ref 11.5–15.5)
WBC: 6 10*3/uL (ref 4.0–10.5)

## 2016-12-16 LAB — PLATELET INHIBITION P2Y12: PLATELET FUNCTION P2Y12: 236 [PRU] (ref 194–418)

## 2016-12-16 LAB — BASIC METABOLIC PANEL
Anion gap: 6 (ref 5–15)
BUN: 19 mg/dL (ref 6–20)
CALCIUM: 9 mg/dL (ref 8.9–10.3)
CHLORIDE: 105 mmol/L (ref 101–111)
CO2: 27 mmol/L (ref 22–32)
CREATININE: 0.98 mg/dL (ref 0.61–1.24)
GFR calc non Af Amer: >60 mL/min (ref >60)
Glucose, Bld: 98 mg/dL (ref 65–99)
Potassium: 4 mmol/L (ref 3.5–5.1)
SODIUM: 138 mmol/L (ref 135–145)

## 2016-12-16 MED ORDER — PRASUGREL HCL 10 MG PO TABS
10.0000 mg | ORAL_TABLET | Freq: Every day | ORAL | Status: DC
  Administered 2016-12-16: 09:00:00 10 mg via ORAL
  Filled 2016-12-16: qty 1

## 2016-12-16 MED ORDER — PRASUGREL HCL 10 MG PO TABS: 10.0000 mg | ORAL_TABLET | Freq: Every day | ORAL | 6 refills | Status: DC

## 2016-12-16 MED ORDER — ANGIOPLASTY BOOK
Freq: Once | Status: AC
  Administered 2016-12-16: 07:00:00
  Filled 2016-12-16: qty 1

## 2016-12-16 MED ORDER — ANGIOPLASTY BOOK: 1.0000 | Freq: Once | 0 refills | Status: AC

## 2016-12-16 MED ORDER — ASPIRIN 81 MG PO TBEC: 81.0000 mg | DELAYED_RELEASE_TABLET | Freq: Every day | ORAL | Status: DC

## 2016-12-16 NOTE — Progress Notes (Signed)
Per benefit check for prasugrel 10mg  daily 30 day supply co pay is 45.00 For 90 day supply co pay is 125.00 There is not prior auth needed.

## 2016-12-16 NOTE — Discharge Summary (Signed)
Physician Discharge Summary  Patient ID: Jose Dyer MRN: [id number] DOB/AGE: 68-Oct-1952 68 y.o.  Admit date: 12/15/2016 Discharge date: 12/16/2016  Primary Discharge Diagnosis Angina Pectoris CAD native vessel  Secondary Discharge Diagnosis H/O Stroke Hypertension Hyperlipidemia  Significant Diagnostic Studies: Coronary angiogram 12/15/2016: Normal LV systolic function, EF 02-54%. No wall motion abnormality. Proximal LAD 80-90% stenosis followed by CTO. Faintly fills by antegrade micro channels. There is contralateral collaterals from the right coronary artery which is nondominant. Circumflex coronary artery is large and dominant, distal circumflex has a high-grade 90% stenosis.  Interventional data: Unsuccessful attempt at CTO proximal LAD. Successful PTCA and stenting of the distal circumflex with 3.0 x 20 mm Synergy, stenosis reduced from 90% to 0% with maintenance of TIMI-3 to TIMI-3 flow.  Hospital Course: Patient admitted for elective coronary angiography.  Underwent successful angioplasty to permanent distal circumflex coronary artery but had unsuccessful attempt at revascularization of CTO maximal LAD.  Following morning he remained stable and felt ready for discharge.  Patient's Plavix was discussed in 2 to and switched to Effient as.  P2Y12 inhibition was suboptimal.  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  Went with his wife and discussed in detail. Discharge Exam: Blood pressure (!) 143/55, pulse (!) 47, temperature 98.5 F (36.9 C), temperature source Oral, resp. rate 16, height 5\' 8"  (1.727 m), weight 77 kg (169 lb 12.1 oz), SpO2 98 %.    General appearance: alert, cooperative, appears  stated age and no distress Resp: clear to auscultation bilaterally Cardio: regular rate and rhythm, S1, S2 normal, no murmur, click, rub or gallop GI: soft, non-tender; bowel sounds normal; no masses,  no organomegaly Extremities: extremities normal, atraumatic, no cyanosis or edema Pulses: 2+ and symmetric, No bruit. right radial access site without compliations Neurologic: Grossly normal Labs:   Lab Results  Component Value Date   WBC 6.0 12/16/2016   HGB 11.5 (L) 12/16/2016   HCT 36.1 (L) 12/16/2016   MCV 84.0 12/16/2016   PLT 171 12/16/2016    Recent Labs Lab 12/16/16 0207  NA 138  K 4.0  CL 105  CO2 27  BUN 19  CREATININE 0.98  CALCIUM 9.0  GLUCOSE 98    Lipid Panel     Component Value Date/Time   CHOL 154 09/22/2016 1022   TRIG 190 (H) 09/22/2016 1022   HDL 36 (L) 09/22/2016 1022   CHOLHDL 4.3 09/22/2016 1022   VLDL 38 (H) 09/22/2016 1022   LDLCALC 80 09/22/2016 1022    HEMOGLOBIN A1C Lab Results  Component Value Date   HGBA1C 5.5 01/30/2014   MPG 111 01/30/2014    EKG: S. Bradycardia, otherwise normal EKG, normal sinus rhythm, unchanged from previous tracings.  FOLLOW UP PLANS AND APPOINTMENTS Discharge Instructions    Amb Referral to Cardiac Rehabilitation    Complete by:  As directed    Diagnosis:   Coronary Stents PTCA     Diet - low sodium heart healthy    Complete by:  As directed    Increase activity slowly    Complete by:  As directed      Allergies as of 12/16/2016   No Known Allergies     Medication List  STOP taking these medications   clopidogrel 75 MG tablet Commonly known as:  PLAVIX     TAKE these medications   amLODipine 5 MG tablet Commonly known as:  NORVASC Take 5 mg by mouth at bedtime.   angioplasty book Misc 1 each by Does not apply route once.   aspirin 81 MG tablet Take 81 mg by mouth daily.   CO Q-10 PO Take 1 tablet by mouth daily.   fenofibrate 54 MG tablet TAKE 1 TABLET BY MOUTH  DAILY    metoprolol succinate 25 MG 24 hr tablet Commonly known as:  TOPROL-XL Take 25 mg by mouth daily.   multivitamin with minerals tablet Take 1 tablet by mouth daily.   nitroGLYCERIN 0.4 MG SL tablet Commonly known as:  NITROSTAT   pantoprazole 40 MG tablet Commonly known as:  PROTONIX Take 40 mg by mouth daily.   prasugrel 10 MG Tabs tablet Commonly known as:  EFFIENT Take 1 tablet (10 mg total) by mouth daily. Start taking on:  12/17/2016   tadalafil 5 MG tablet Commonly known as:  CIALIS Take 1 tablet (5 mg total) by mouth daily as needed for erectile dysfunction.   VITAMIN B-12 PO Take 1 tablet by mouth daily.   VITAMIN D3 PO Take 1 tablet by mouth daily.      Follow-up Information    Adrian Prows, MD Follow up on 12/23/2016.   Specialty:  Cardiology Why:  Keep previous appointent Contact information: [place] [place] [address] [phone]            Adrian Prows, MD 12/16/2016, 9:52 AM  Pager: [phone] Office: [phone] If no answer: [phone]

## 2016-12-16 NOTE — Progress Notes (Addendum)
CARDIAC REHAB PHASE I   PRE:  Rate/Rhythm: 47 SB    BP: sitting 144/61    SaO2:   MODE:  Ambulation: 1000 ft   POST:  Rate/Rhythm: 61 SR    BP: sitting 166/50     SaO2:   Tolerated well, no problems. BP elevated slightly. Ed completed with pt and wife.  Gave walking guidelines but encouraged no golf or weights for 1 week atleast. Discussed talking with Dr. Einar Gip regarding plan. He is interested in [facility] and I will send referral to [place]. He understands importance of Effient. [place], ACSM 12/16/2016 8:59 AM

## 2016-12-23 DIAGNOSIS — I25119 Atherosclerotic heart disease of native coronary artery with unspecified angina pectoris: Secondary | ICD-10-CM | POA: Diagnosis not present

## 2016-12-23 DIAGNOSIS — Z8673 Personal history of transient ischemic attack (TIA), and cerebral infarction without residual deficits: Secondary | ICD-10-CM | POA: Diagnosis not present

## 2016-12-23 DIAGNOSIS — I209 Angina pectoris, unspecified: Secondary | ICD-10-CM | POA: Diagnosis not present

## 2016-12-24 ENCOUNTER — Telehealth (HOSPITAL_COMMUNITY): Payer: Self-pay

## 2016-12-24 NOTE — Telephone Encounter (Signed)
Verified [employer] Medicare insurance benefits through [employer] Copay $20.00 No Coinsurance or Deductible. Out of Pocket $4400.00, pt has met $621.01 Reference [phone]... KJ

## 2017-01-27 ENCOUNTER — Ambulatory Visit (HOSPITAL_COMMUNITY)
Admission: RE | Admit: 2017-01-27 | Discharge: 2017-01-28 | Disposition: A | Discharge status: Home / Self Care | Payer: Medicare Other | Source: Ambulatory Visit | Attending: Interventional Cardiology | Admitting: Interventional Cardiology

## 2017-01-27 ENCOUNTER — Encounter (HOSPITAL_COMMUNITY)
Admission: RE | Disposition: A | Discharge status: Home / Self Care | Payer: Self-pay | Source: Ambulatory Visit | Attending: Interventional Cardiology

## 2017-01-27 ENCOUNTER — Encounter (HOSPITAL_COMMUNITY): Payer: Self-pay | Admitting: Interventional Cardiology

## 2017-01-27 DIAGNOSIS — E785 Hyperlipidemia, unspecified: Secondary | ICD-10-CM | POA: Diagnosis present

## 2017-01-27 DIAGNOSIS — I10 Essential (primary) hypertension: Secondary | ICD-10-CM | POA: Diagnosis not present

## 2017-01-27 DIAGNOSIS — K219 Gastro-esophageal reflux disease without esophagitis: Secondary | ICD-10-CM | POA: Insufficient documentation

## 2017-01-27 DIAGNOSIS — I209 Angina pectoris, unspecified: Secondary | ICD-10-CM | POA: Diagnosis present

## 2017-01-27 DIAGNOSIS — I2582 Chronic total occlusion of coronary artery: Secondary | ICD-10-CM

## 2017-01-27 DIAGNOSIS — Z955 Presence of coronary angioplasty implant and graft: Secondary | ICD-10-CM | POA: Diagnosis not present

## 2017-01-27 DIAGNOSIS — Z79899 Other long term (current) drug therapy: Secondary | ICD-10-CM | POA: Diagnosis not present

## 2017-01-27 DIAGNOSIS — I25119 Atherosclerotic heart disease of native coronary artery with unspecified angina pectoris: Secondary | ICD-10-CM | POA: Diagnosis not present

## 2017-01-27 DIAGNOSIS — I2510 Atherosclerotic heart disease of native coronary artery without angina pectoris: Secondary | ICD-10-CM

## 2017-01-27 DIAGNOSIS — Z7982 Long term (current) use of aspirin: Secondary | ICD-10-CM | POA: Insufficient documentation

## 2017-01-27 HISTORY — PX: LEFT HEART CATH AND CORONARY ANGIOGRAPHY: CATH118249

## 2017-01-27 HISTORY — DX: Personal history of other diseases of the digestive system: Z87.19

## 2017-01-27 HISTORY — PX: CORONARY CTO INTERVENTION: CATH118236

## 2017-01-27 HISTORY — PX: CORONARY ANGIOPLASTY WITH STENT PLACEMENT: SHX49

## 2017-01-27 HISTORY — PX: CORONARY STENT INTERVENTION: CATH118234

## 2017-01-27 HISTORY — DX: Personal history of urinary calculi: Z87.442

## 2017-01-27 LAB — POCT ACTIVATED CLOTTING TIME
ACTIVATED CLOTTING TIME: 219 s
ACTIVATED CLOTTING TIME: 290 s
Activated Clotting Time: 219 seconds
Activated Clotting Time: 279 seconds
Activated Clotting Time: 351 seconds
Activated Clotting Time: 400 s
Activated Clotting Time: 257 s
Activated Clotting Time: 279 seconds
Activated Clotting Time: 461 s
Activated Clotting Time: 285 seconds
Activated Clotting Time: 312 seconds
Activated Clotting Time: 296 seconds
Activated Clotting Time: 169 seconds

## 2017-01-27 LAB — BASIC METABOLIC PANEL
Anion gap: 7 (ref 5–15)
BUN: 23 mg/dL — AB (ref 6–20)
CO2: 26 mmol/L (ref 22–32)
Calcium: 8.7 mg/dL — ABNORMAL LOW (ref 8.9–10.3)
Chloride: 106 mmol/L (ref 101–111)
Creatinine, Ser: 0.99 mg/dL (ref 0.61–1.24)
GFR calc Af Amer: >60 mL/min (ref >60)
GLUCOSE: 101 mg/dL — AB (ref 65–99)
Potassium: 3.6 mmol/L (ref 3.5–5.1)
Sodium: 139 mmol/L (ref 135–145)

## 2017-01-27 LAB — CBC
HEMATOCRIT: 38.2 % — AB (ref 39.0–52.0)
Hemoglobin: 12.4 g/dL — ABNORMAL LOW (ref 13.0–17.0)
MCH: 27.4 pg (ref 26.0–34.0)
MCHC: 32.5 g/dL (ref 30.0–36.0)
MCV: 84.5 fL (ref 78.0–100.0)
PLATELETS: 176 10*3/uL (ref 150–400)
RBC: 4.52 MIL/uL (ref 4.22–5.81)
RDW: 14.7 % (ref 11.5–15.5)
WBC: 6 10*3/uL (ref 4.0–10.5)

## 2017-01-27 LAB — PROTIME-INR
INR: 1.14
Prothrombin Time: 14.6 seconds (ref 11.4–15.2)

## 2017-01-27 SURGERY — CORONARY CTO INTERVENTION
Anesthesia: LOCAL

## 2017-01-27 MED ORDER — FENTANYL CITRATE (PF) 100 MCG/2ML IJ SOLN
INTRAMUSCULAR | Status: DC | PRN
  Administered 2017-01-27 (×4): 25 ug via INTRAVENOUS

## 2017-01-27 MED ORDER — IOPAMIDOL (ISOVUE-370) INJECTION 76%
INTRAVENOUS | Status: AC
  Filled 2017-01-27: qty 125

## 2017-01-27 MED ORDER — MIDAZOLAM HCL 2 MG/2ML IJ SOLN
INTRAMUSCULAR | Status: AC
  Filled 2017-01-27: qty 2

## 2017-01-27 MED ORDER — HEPARIN (PORCINE) IN NACL 2-0.9 UNIT/ML-% IJ SOLN
INTRAMUSCULAR | Status: AC
  Filled 2017-01-27: qty 500

## 2017-01-27 MED ORDER — IOPAMIDOL (ISOVUE-370) INJECTION 76%
INTRAVENOUS | Status: AC
  Filled 2017-01-27: qty 50

## 2017-01-27 MED ORDER — PANTOPRAZOLE SODIUM 40 MG PO TBEC
40.0000 mg | DELAYED_RELEASE_TABLET | Freq: Every day | ORAL | Status: DC
  Administered 2017-01-28: 10:00:00 40 mg via ORAL
  Filled 2017-01-27: qty 1

## 2017-01-27 MED ORDER — TICAGRELOR 90 MG PO TABS: 90.0000 mg | ORAL_TABLET | Freq: Two times a day (BID) | ORAL | Status: DC

## 2017-01-27 MED ORDER — ASPIRIN EC 81 MG PO TBEC
81.0000 mg | DELAYED_RELEASE_TABLET | Freq: Every day | ORAL | Status: DC
  Administered 2017-01-28: 10:00:00 81 mg via ORAL
  Filled 2017-01-27: qty 1

## 2017-01-27 MED ORDER — ASPIRIN 81 MG PO CHEW: 81.0000 mg | CHEWABLE_TABLET | Freq: Every day | ORAL | Status: DC

## 2017-01-27 MED ORDER — SODIUM CHLORIDE 0.9% FLUSH: 3.0000 mL | INTRAVENOUS | Status: DC | PRN

## 2017-01-27 MED ORDER — LIDOCAINE HCL (PF) 1 % IJ SOLN
INTRAMUSCULAR | Status: DC | PRN
  Administered 2017-01-27: 30 mL

## 2017-01-27 MED ORDER — ADULT MULTIVITAMIN W/MINERALS CH
1.0000 | ORAL_TABLET | Freq: Every day | ORAL | Status: DC
  Administered 2017-01-27 – 2017-01-28 (×2): 1 via ORAL
  Filled 2017-01-27 (×2): qty 1

## 2017-01-27 MED ORDER — ASPIRIN 81 MG PO CHEW: 81.0000 mg | CHEWABLE_TABLET | ORAL | Status: DC

## 2017-01-27 MED ORDER — MIDAZOLAM HCL 2 MG/2ML IJ SOLN
INTRAMUSCULAR | Status: DC | PRN
Start: 2017-01-27 — End: 2017-01-27
  Administered 2017-01-27 (×2): 1 mg via INTRAVENOUS
  Administered 2017-01-27: 2 mg via INTRAVENOUS
  Administered 2017-01-27: 1 mg via INTRAVENOUS

## 2017-01-27 MED ORDER — HEPARIN SODIUM (PORCINE) 1000 UNIT/ML IJ SOLN
INTRAMUSCULAR | Status: AC
  Filled 2017-01-27: qty 1

## 2017-01-27 MED ORDER — NITROGLYCERIN 0.4 MG SL SUBL: 0.4000 mg | SUBLINGUAL_TABLET | SUBLINGUAL | Status: DC | PRN

## 2017-01-27 MED ORDER — SODIUM CHLORIDE 0.9% FLUSH: 3.0000 mL | Freq: Two times a day (BID) | INTRAVENOUS | Status: DC

## 2017-01-27 MED ORDER — METOPROLOL SUCCINATE ER 25 MG PO TB24
25.0000 mg | ORAL_TABLET | Freq: Every day | ORAL | Status: DC
  Administered 2017-01-28: 25 mg via ORAL
  Filled 2017-01-27: qty 1

## 2017-01-27 MED ORDER — SODIUM CHLORIDE 0.9 % IV SOLN: 250.0000 mL | INTRAVENOUS | Status: DC | PRN

## 2017-01-27 MED ORDER — HEPARIN (PORCINE) IN NACL 2-0.9 UNIT/ML-% IJ SOLN
INTRAMUSCULAR | Status: AC
  Filled 2017-01-27: qty 1000

## 2017-01-27 MED ORDER — LIDOCAINE HCL (PF) 1 % IJ SOLN
INTRAMUSCULAR | Status: AC
  Filled 2017-01-27: qty 30

## 2017-01-27 MED ORDER — SODIUM CHLORIDE 0.9 % IV SOLN
INTRAVENOUS | Status: DC
  Administered 2017-01-27: 150 mL via INTRAVENOUS

## 2017-01-27 MED ORDER — HEPARIN (PORCINE) IN NACL 2-0.9 UNIT/ML-% IJ SOLN
INTRAMUSCULAR | Status: AC | PRN
  Administered 2017-01-27: 3000 mL via INTRA_ARTERIAL

## 2017-01-27 MED ORDER — SODIUM CHLORIDE 0.9 % IV BOLUS (SEPSIS)
200.0000 mL | Freq: Once | INTRAVENOUS | Status: AC
  Administered 2017-01-27: 16:00:00 200 mL via INTRAVENOUS

## 2017-01-27 MED ORDER — SODIUM CHLORIDE 0.9 % IV SOLN
250.0000 mL | INTRAVENOUS | Status: DC | PRN
  Administered 2017-01-27: 16:00:00 200 mL via INTRAVENOUS

## 2017-01-27 MED ORDER — LABETALOL HCL 5 MG/ML IV SOLN: 10.0000 mg | INTRAVENOUS | Status: AC | PRN

## 2017-01-27 MED ORDER — ONDANSETRON HCL 4 MG/2ML IJ SOLN
4.0000 mg | Freq: Four times a day (QID) | INTRAMUSCULAR | Status: DC | PRN
  Filled 2017-01-27: qty 2

## 2017-01-27 MED ORDER — TICAGRELOR 90 MG PO TABS
90.0000 mg | ORAL_TABLET | Freq: Two times a day (BID) | ORAL | Status: DC
  Administered 2017-01-27 – 2017-01-28 (×2): 90 mg via ORAL
  Filled 2017-01-27 (×3): qty 1

## 2017-01-27 MED ORDER — SODIUM CHLORIDE 0.9 % IV SOLN
INTRAVENOUS | Status: AC
  Administered 2017-01-27: 13:00:00 via INTRAVENOUS

## 2017-01-27 MED ORDER — LIDOCAINE-EPINEPHRINE 1 %-1:100000 IJ SOLN
INTRAMUSCULAR | Status: AC
  Filled 2017-01-27: qty 1

## 2017-01-27 MED ORDER — SODIUM CHLORIDE 0.9 % IV SOLN: Freq: Once | INTRAVENOUS | Status: DC

## 2017-01-27 MED ORDER — ACETAMINOPHEN 325 MG PO TABS: 650.0000 mg | ORAL_TABLET | ORAL | Status: DC | PRN

## 2017-01-27 MED ORDER — IOPAMIDOL (ISOVUE-370) INJECTION 76%
INTRAVENOUS | Status: AC
  Filled 2017-01-27: qty 100

## 2017-01-27 MED ORDER — MIDAZOLAM HCL 2 MG/2ML IJ SOLN
INTRAMUSCULAR | Status: AC
Start: 2017-01-27 — End: 2017-01-27
  Filled 2017-01-27: qty 2

## 2017-01-27 MED ORDER — FENTANYL CITRATE (PF) 100 MCG/2ML IJ SOLN
INTRAMUSCULAR | Status: AC
  Filled 2017-01-27: qty 2

## 2017-01-27 MED ORDER — HEPARIN SODIUM (PORCINE) 1000 UNIT/ML IJ SOLN
INTRAMUSCULAR | Status: DC | PRN
  Administered 2017-01-27 (×2): 2000 [IU] via INTRAVENOUS
  Administered 2017-01-27 (×3): 4000 [IU] via INTRAVENOUS
  Administered 2017-01-27 (×2): 3000 [IU] via INTRAVENOUS
  Administered 2017-01-27: 7000 [IU] via INTRAVENOUS

## 2017-01-27 MED ORDER — HYDRALAZINE HCL 20 MG/ML IJ SOLN: 5.0000 mg | INTRAMUSCULAR | Status: AC | PRN

## 2017-01-27 MED ORDER — IOPAMIDOL (ISOVUE-370) INJECTION 76%
INTRAVENOUS | Status: DC | PRN
  Administered 2017-01-27: 275 mL via INTRA_ARTERIAL

## 2017-01-27 MED ORDER — NITROGLYCERIN 1 MG/10 ML FOR IR/CATH LAB
INTRA_ARTERIAL | Status: AC
  Filled 2017-01-27: qty 10

## 2017-01-27 MED ORDER — FENOFIBRATE 54 MG PO TABS
54.0000 mg | ORAL_TABLET | Freq: Every day | ORAL | Status: DC
  Administered 2017-01-28: 54 mg via ORAL
  Filled 2017-01-27: qty 1

## 2017-01-27 MED ORDER — AMLODIPINE BESYLATE 5 MG PO TABS
5.0000 mg | ORAL_TABLET | Freq: Every day | ORAL | Status: DC
  Administered 2017-01-27: 5 mg via ORAL
  Filled 2017-01-27: qty 1

## 2017-01-27 MED ORDER — BUPIVACAINE HCL (PF) 0.25 % IJ SOLN
INTRAMUSCULAR | Status: DC | PRN
  Administered 2017-01-27: 8 mL

## 2017-01-27 MED ORDER — SODIUM CHLORIDE 0.9 % WEIGHT BASED INFUSION
3.0000 mL/kg/h | INTRAVENOUS | Status: DC
  Administered 2017-01-27: 3 mL/kg/h via INTRAVENOUS

## 2017-01-27 MED ORDER — SODIUM CHLORIDE 0.9 % WEIGHT BASED INFUSION: 1.0000 mL/kg/h | INTRAVENOUS | Status: DC

## 2017-01-27 SURGICAL SUPPLY — 37 items
BALLN EMERGE MR 2.0X15 (BALLOONS) ×2
BALLN EMERGE MR 2.5X15 (BALLOONS) ×2
BALLN EMERGE MR 2.5X20 (BALLOONS) ×2
BALLN EMERGE MR PUSH 1.5X12 (BALLOONS) ×2
BALLN ~~LOC~~ EMERGE MR 4.0X15 (BALLOONS) ×2
BALLOON EMERGE MR 2.0X15 (BALLOONS) ×1 IMPLANT
BALLOON EMERGE MR 2.5X15 (BALLOONS) ×1 IMPLANT
BALLOON EMERGE MR 2.5X20 (BALLOONS) ×1 IMPLANT
BALLOON EMERGE MR PUSH 1.5X12 (BALLOONS) ×1 IMPLANT
BALLOON ~~LOC~~ EMERGE MR 4.0X15 (BALLOONS) ×1 IMPLANT
CATH CROSSBOSS CTO (CATHETERS) ×2 IMPLANT
CATH INFINITI JR4 5F (CATHETERS) ×2 IMPLANT
CATH MACH1 8F CLS4 (CATHETERS) ×2 IMPLANT
CATH MACH1 8FR CLS3.5 (CATHETERS) ×2 IMPLANT
CATH STINGRAY CTO 135CM (CATHETERS) ×2 IMPLANT
CATH TRAPPER 6-8F (CATHETERS) ×2 IMPLANT
CATH TURNPIKE 135CM (CATHETERS) ×2 IMPLANT
CATH TURNPIKE SPIRAL 135CM (CATHETERS) ×2 IMPLANT
CATHETER GUIDEZILLA II 7F (CATHETERS) ×2 IMPLANT
COVER PRB 48X5XTLSCP FOLD TPE (BAG) ×1 IMPLANT
COVER PROBE 5X48 (BAG) ×1
KIT ENCORE 26 ADVANTAGE (KITS) ×2 IMPLANT
KIT HEART LEFT (KITS) ×4 IMPLANT
PACK CARDIAC CATHETERIZATION (CUSTOM PROCEDURE TRAY) ×2 IMPLANT
SHEATH BRITE TIP 8FR 35CM (SHEATH) ×2 IMPLANT
SHEATH PINNACLE 5F 10CM (SHEATH) ×2 IMPLANT
SHEATH PINNACLE 8F 10CM (SHEATH) ×2 IMPLANT
STENT SYNERGY DES 3.5X38 (Permanent Stent) ×2 IMPLANT
TRANSDUCER W/STOPCOCK (MISCELLANEOUS) ×4 IMPLANT
TUBING CIL FLEX 10 FLL-RA (TUBING) ×4 IMPLANT
VALVE GUARDIAN II ~~LOC~~ HEMO (MISCELLANEOUS) ×2 IMPLANT
WIRE ASAHI CONFIANZPRO12 300CM (WIRE) ×2 IMPLANT
WIRE ASAHI MIRACLEBROS-6 180CM (WIRE) ×2 IMPLANT
WIRE ASAHI PROWATER 300CM (WIRE) ×2 IMPLANT
WIRE EMERALD 3MM-J .035X150CM (WIRE) ×2 IMPLANT
WIRE FIGHTER CROSSING 190CM (WIRE) ×2 IMPLANT
WIRE MARVEL STR TIP 190CM (WIRE) ×2 IMPLANT

## 2017-01-27 NOTE — Progress Notes (Addendum)
Site area: left groin  Site Prior to Removal:  Level 2  Pressure Applied For 35 MINUTES    Minutes Beginning at 1600  Manual:   Yes.    Patient Status During Pull:  Vagal response  Post Pull Groin Site:  Level 2  Post Pull Instructions Given:  Yes.    Post Pull Pulses Present:  Yes.    Dressing Applied:  Yes.    Comments:   Patient arrived on floor at 1300 with level 2 hematoma to right femoral site.  Left femoral site level 0 on arrival, which eventually progressed to level 2 as well.  From the time patient arrived on the unit (1300) until sheaths were pulled (1600), Jose Salmon, RN and Mead, SWOT RN applied manual pressure to bilateral groin site hematomas for approximately 1.5 hours.  Patient tolerated this very well.Patient vagalled during pull, with drop in BP, nausea, and extreme unease.  Relieved in 10 minutes after fluid bolus and zofran administration. (additional bolus given later during extended hold on right femoral site).  Hematomas maximal size:  Right, 11 X 3 cm, left, 10 X 4 cm.

## 2017-01-27 NOTE — H&P (Signed)
Cardiology Consultation:   Patient ID: Jose Dyer; [id number]; 05-31-1951   Admit date: 01/27/2017 Date of Consult: 01/27/2017  Primary Care Provider: Denita Lung, MD Primary Cardiologist: Dr. Einar Gip    Patient Profile:   Jose Dyer is a 66 y.o. male with a hx of CAD who is being seen today for the evaluation of LAD CTO at the request of Dr. Einar Gip.  History of Present Illness:   [name] has been a patient of Dr. Einar Gip.  He has had angina and was found to have an LAD occlusion.  He had a PCI attempt of a chronically occluded LAD which was unsuccessful due to being unable to cross the lesion.    We were asked to evaluate the lesion to see if CTO PCI would be a possibility.  Past Medical History:  Diagnosis Date  . Anginal pain ([place])   . Chronic kidney disease    kidney stone- last one 20 years ago  . Corneal scarring   . Coronary artery disease   . Dyslipidemia   . FHx: cardiovascular disease   . GERD (gastroesophageal reflux disease)   . Hypertension   . Stroke [facility])    2014 - on Plaivx    Past Surgical History:  Procedure Laterality Date  . COLONOSCOPY  2004  . CORONARY STENT INTERVENTION  12/15/2016    Successful PTCA and stenting of the distal circumflex with 3.0 x 20 mm Synergy, stenosis reduced from 90% to 0% with maintenance of TIMI-3 to TIMI-3 flow.  . CORONARY STENT INTERVENTION N/A 12/15/2016   Procedure: Coronary Stent Intervention;  Surgeon: Adrian Prows, MD;  Location: [place] CV LAB;  Service: Cardiovascular;  Laterality: N/A;  LAD  . LEFT HEART CATH AND CORONARY ANGIOGRAPHY N/A 12/15/2016   Procedure: Left Heart Cath and Coronary Angiography;  Surgeon: Adrian Prows, MD;  Location: [place] [facility];  Service: Cardiovascular;  Laterality: N/A;     Inpatient Medications: Scheduled Meds: . aspirin  81 mg Oral Pre-Cath  . sodium chloride flush  3 mL Intravenous Q12H   Continuous Infusions: . sodium chloride    . sodium chloride 3 mL/kg/hr  (01/27/17 0742)   Followed by  . sodium chloride     PRN Meds: sodium chloride, sodium chloride flush  Allergies:   No Known Allergies  Social History:   Social History   Social History  . Marital status: Married    Spouse name: N/A  . Number of children: N/A  . Years of education: N/A   Occupational History  . Not on file.   Social History Main Topics  . Smoking status: Never Smoker  . Smokeless tobacco: Never Used  . Alcohol use No  . Drug use: No  . Sexual activity: Yes   Other Topics Concern  . Not on file   Social History Narrative  . No narrative on file    Family History:   The patient's family history is negative for Colon cancer, Esophageal cancer, Rectal cancer, and Stomach cancer.  ROS:  Please see the history of present illness. Angina as noted above. No bleeding problems recently after switch to Brilinta. Had gastritis several months ago. ROS  All other ROS reviewed and negative.     Physical Exam/Data:   Vitals:   01/27/17 0637 01/27/17 0700  BP: 137/66   Pulse: (!) 44   Resp: 18   Temp: 97.7 F (36.5 C)   TempSrc: Oral   SpO2: 100%   Weight: 170 lb (  77.1 kg) 170 lb (77.1 kg)  Height: 5\' 8"  (1.727 m)    No intake or output data in the 24 hours ending 01/27/17 0807 Filed Weights   01/27/17 0637 01/27/17 0700  Weight: 170 lb (77.1 kg) 170 lb (77.1 kg)   Body mass index is 25.85 kg/m.  General:  Well nourished, well developed, in no acute distressCath Lab Visit (complete for each Cath Lab visit)  Clinical Evaluation Leading to the Procedure:   ACS: No.  Non-ACS:    Anginal Classification: CCS III  Anti-ischemic medical therapy: Maximal Therapy (2 or more classes of medications)  Non-Invasive Test Results: No non-invasive testing performed  Prior CABG: No previous CABG       HEENT: normal Lymph: no adenopathy Neck: no JVD Endocrine:  No thryomegaly Vascular: No carotid bruits; FA pulses 2+ bilaterally  Cardiac:  normal  S1, S2; RRR; no murmur  Lungs:  clear to auscultation bilaterally, no wheezing, rhonchi or rales  Abd: soft, nontender, no hepatomegaly  Ext: no edema Musculoskeletal:  No deformities, BUE and BLE strength normal and equal Skin: warm and dry  Neuro:  CNs 2-12 intact, no focal abnormalities noted Psych:  Normal affect      Relevant CV Studies: Prior cath films reviewed  Laboratory Data:  Chemistry Recent Labs Lab 01/27/17 0713  NA 139  K 3.6  CL 106  CO2 26  GLUCOSE 101*  BUN 23*  CREATININE 0.99  CALCIUM 8.7*  GFRNONAA >60  GFRAA >60  ANIONGAP 7    No results for input(s): PROT, ALBUMIN, AST, ALT, ALKPHOS, BILITOT in the last 168 hours. Hematology Recent Labs Lab 01/27/17 0713  WBC 6.0  RBC 4.52  HGB 12.4*  HCT 38.2*  MCV 84.5  MCH 27.4  MCHC 32.5  RDW 14.7  PLT 176   Cardiac EnzymesNo results for input(s): TROPONINI in the last 168 hours. No results for input(s): TROPIPOC in the last 168 hours.  BNPNo results for input(s): BNP, PROBNP in the last 168 hours.  DDimer No results for input(s): DDIMER in the last 168 hours.  Radiology/Studies:  No results found.  Assessment and Plan:   1. CAD with LAD occlusion: Risks and benefits of CTO PCI were explained to the patient and he was willing to proceed.  The wife was not available but I left a message for her on voicemail.  Dr. Einar Gip also explained the procedure to the patient.  Will plan for femoral access for this procedure.   SignedLarae Grooms, MD  01/27/2017 8:07 AM

## 2017-01-27 NOTE — Progress Notes (Signed)
Site area: RFA Site Prior to Removal:  Level 1 Pressure Applied For: 95 min Manual:   yes Patient Status During Pull:  BP to 87 syst-350cc total NS bolus-no change in HR Post Pull Site:  Level 1 Post Pull Instructions Given:  yes Post Pull Pulses Present: palpable Dressing Applied:  tegaderm Bedrest begins @ 1730 Comments: by Eustace Quail and canderson

## 2017-01-27 NOTE — Care Management Note (Addendum)
Case Management Note  Patient Details  Name: Jose Dyer MRN: [id number] Date of Birth: September 06, 1950  Subjective/Objective:   From home, s/p coronary stent intervention, he is already on brilinta.  He has [employer] Medicare insruance.  PCP    Jill Alexanders           Action/Plan: NCM will follow for dc needs.  Expected Discharge Date:                  Expected Discharge Plan:  Home/Self Care  In-House Referral:     Discharge planning Services  CM Consult  Post Acute Care Choice:    Choice offered to:     DME Arranged:    DME Agency:     HH Arranged:    HH Agency:     Status of Service:  In process, will continue to follow  If discussed at Long Length of Stay Meetings, dates discussed:    Additional Comments:  Zenon Mayo, RN 01/27/2017, 4:57 PM

## 2017-01-28 DIAGNOSIS — Z7982 Long term (current) use of aspirin: Secondary | ICD-10-CM | POA: Diagnosis not present

## 2017-01-28 DIAGNOSIS — I2510 Atherosclerotic heart disease of native coronary artery without angina pectoris: Secondary | ICD-10-CM

## 2017-01-28 DIAGNOSIS — E785 Hyperlipidemia, unspecified: Secondary | ICD-10-CM

## 2017-01-28 DIAGNOSIS — Z79899 Other long term (current) drug therapy: Secondary | ICD-10-CM | POA: Diagnosis not present

## 2017-01-28 DIAGNOSIS — I25119 Atherosclerotic heart disease of native coronary artery with unspecified angina pectoris: Secondary | ICD-10-CM | POA: Diagnosis not present

## 2017-01-28 DIAGNOSIS — I209 Angina pectoris, unspecified: Secondary | ICD-10-CM | POA: Diagnosis not present

## 2017-01-28 DIAGNOSIS — I2582 Chronic total occlusion of coronary artery: Secondary | ICD-10-CM | POA: Diagnosis not present

## 2017-01-28 DIAGNOSIS — I10 Essential (primary) hypertension: Secondary | ICD-10-CM

## 2017-01-28 DIAGNOSIS — I25118 Atherosclerotic heart disease of native coronary artery with other forms of angina pectoris: Secondary | ICD-10-CM

## 2017-01-28 DIAGNOSIS — K219 Gastro-esophageal reflux disease without esophagitis: Secondary | ICD-10-CM | POA: Diagnosis not present

## 2017-01-28 DIAGNOSIS — Z955 Presence of coronary angioplasty implant and graft: Secondary | ICD-10-CM | POA: Diagnosis not present

## 2017-01-28 LAB — BASIC METABOLIC PANEL
ANION GAP: 6 (ref 5–15)
BUN: 17 mg/dL (ref 6–20)
CHLORIDE: 106 mmol/L (ref 101–111)
CO2: 26 mmol/L (ref 22–32)
Calcium: 8.6 mg/dL — ABNORMAL LOW (ref 8.9–10.3)
Creatinine, Ser: 1.03 mg/dL (ref 0.61–1.24)
GFR calc non Af Amer: >60 mL/min (ref >60)
Glucose, Bld: 111 mg/dL — ABNORMAL HIGH (ref 65–99)
POTASSIUM: 4 mmol/L (ref 3.5–5.1)
SODIUM: 138 mmol/L (ref 135–145)

## 2017-01-28 LAB — CBC
HCT: 30.8 % — ABNORMAL LOW (ref 39.0–52.0)
HEMOGLOBIN: 10.1 g/dL — AB (ref 13.0–17.0)
MCH: 27.3 pg (ref 26.0–34.0)
MCHC: 32.8 g/dL (ref 30.0–36.0)
MCV: 83.2 fL (ref 78.0–100.0)
PLATELETS: 189 10*3/uL (ref 150–400)
RBC: 3.7 MIL/uL — AB (ref 4.22–5.81)
RDW: 14.8 % (ref 11.5–15.5)
WBC: 7.5 10*3/uL (ref 4.0–10.5)

## 2017-01-28 MED ORDER — ANGIOPLASTY BOOK
Freq: Once | Status: DC
  Filled 2017-01-28: qty 1

## 2017-01-28 MED ORDER — TICAGRELOR 90 MG PO TABS: 90.0000 mg | ORAL_TABLET | Freq: Two times a day (BID) | ORAL | 0 refills | Status: DC

## 2017-01-28 MED ORDER — BRILINTA 90 MG PO TABS: 90.0000 mg | ORAL_TABLET | Freq: Two times a day (BID) | ORAL | 0 refills | Status: DC

## 2017-01-28 MED ORDER — NITROGLYCERIN 0.4 MG SL SUBL: 0.4000 mg | SUBLINGUAL_TABLET | SUBLINGUAL | 12 refills | Status: DC | PRN

## 2017-01-28 MED ORDER — TICAGRELOR 90 MG PO TABS
90.0000 mg | ORAL_TABLET | Freq: Two times a day (BID) | ORAL | 3 refills | Status: DC
Start: 2017-01-28 — End: 2018-05-25

## 2017-01-28 NOTE — Discharge Instructions (Signed)
No driving for 5 days. No lifting over 5 lbs for 1 week. No sexual activity for 1 week. You may return to work on 5-7 days. Keep procedure site clean & dry. If you notice increased pain, swelling, bleeding or pus, call/return!  You may shower, but no soaking baths/hot tubs/pools for 1 week.

## 2017-01-28 NOTE — Progress Notes (Signed)
Progress Note  Patient Name: Jose Dyer Date of Encounter: 01/28/2017  Primary Cardiologist: Dr. Einar Gip  Subjective   Had some bleeding in right groin early this AM. Now stable.  No chest pain.  Inpatient Medications    Scheduled Meds: . amLODipine  5 mg Oral QHS  . angioplasty book   Does not apply Once  . aspirin EC  81 mg Oral Daily  . fenofibrate  54 mg Oral Daily  . metoprolol succinate  25 mg Oral Daily  . multivitamin with minerals  1 tablet Oral Daily  . pantoprazole  40 mg Oral Daily  . sodium chloride flush  3 mL Intravenous Q12H  . ticagrelor  90 mg Oral BID   Continuous Infusions: . sodium chloride 200 mL (01/27/17 1620)  . sodium chloride 150 mL (01/27/17 1700)   PRN Meds: sodium chloride, acetaminophen, nitroGLYCERIN, ondansetron (ZOFRAN) IV, sodium chloride flush   Vital Signs    Vitals:   01/27/17 2220 01/28/17 0315 01/28/17 0350 01/28/17 0610  BP: (!) 120/44 (!) 96/46 (!) 108/50 (!) 115/50  Pulse: (!) 56 (!) 49 (!) 55 (!) 47  Resp: 19 17 17 15   Temp:   98.9 F (37.2 C)   TempSrc:   Oral   SpO2: 97% 97% 99% 96%  Weight:   169 lb 12.1 oz (77 kg)   Height:        Intake/Output Summary (Last 24 hours) at 01/28/17 0722 Last data filed at 01/28/17 0600  Gross per 24 hour  Intake             1905 ml  Output             2900 ml  Net             -995 ml   Filed Weights   01/27/17 0637 01/27/17 0700 01/28/17 0350  Weight: 170 lb (77.1 kg) 170 lb (77.1 kg) 169 lb 12.1 oz (77 kg)    Telemetry    SB - Personally Reviewed  ECG    SB, PVC, no ST changes - Personally Reviewed  Physical Exam   GEN: No acute distress.   Neck: No JVD Cardiac: RRR, no murmurs, rubs, or gallops.  Respiratory: Clear to auscultation bilaterally. GI: Soft, nontender, non-distended  MS: No edema; No deformity. Bruising on both groins, soft; 2+ PT pulse bilaterally; palpable right femoral pulse Neuro:  Nonfocal  Psych: Normal affect   Labs    Chemistry Recent  Labs Lab 01/27/17 0713 01/28/17 0303  NA 139 138  K 3.6 4.0  CL 106 106  CO2 26 26  GLUCOSE 101* 111*  BUN 23* 17  CREATININE 0.99 1.03  CALCIUM 8.7* 8.6*  GFRNONAA >60 >60  GFRAA >60 >60  ANIONGAP 7 6     Hematology Recent Labs Lab 01/27/17 0713 01/28/17 0303  WBC 6.0 7.5  RBC 4.52 3.70*  HGB 12.4* 10.1*  HCT 38.2* 30.8*  MCV 84.5 83.2  MCH 27.4 27.3  MCHC 32.5 32.8  RDW 14.7 14.8  PLT 176 189    Cardiac EnzymesNo results for input(s): TROPONINI in the last 168 hours. No results for input(s): TROPIPOC in the last 168 hours.   BNPNo results for input(s): BNP, PROBNP in the last 168 hours.   DDimer No results for input(s): DDIMER in the last 168 hours.   Radiology    No results found.  Cardiac Studies     Patient Profile     66 y.o. male s/pCTO  PCI of LAD  Assessment & Plan    1) Continue DAPT.  Aggressive secondary prevention.  He can resume supplemental iron for a month.  Plan discharge later today if he does well with cardiac rehab.    F/u with Dr. Einar Gip.  Signed, Larae Grooms, MD  01/28/2017, 7:22 AM

## 2017-01-28 NOTE — Discharge Summary (Signed)
Discharge Summary    Patient ID: Jose Dyer,  MRN: [id number], DOB/AGE: [birth year] 66 y.o.  Admit date: 01/27/2017 Discharge date: 01/28/2017  Primary Care Provider: Denita Lung Primary Cardiologist: Dr. Einar Gip   Discharge Diagnoses    Principal Problem:   Coronary artery disease: Cath a. (May 2018) succ PTCA of distal circ, unsucc at CTO prox LAD. b. (June 2018) successful CTO of prox to mid LAD  Active Problems:   Hypertension   Hyperlipidemia LDL goal <100   Angina pectoris ([place])   Allergies No Known Allergies   History of Present Illness    This is a patient of Dr. Einar Gip, who we saw for LAD CTO. He was admitted May 1 for elective coronary angiography and underwent an successful angioplasty to permanent distal circumflex coronary artery but had an unsuccessful attempts at revascularizing a chronic total occlusion of the maximal LAD.  He switched from Plavix to Effient this admission and the next morning was stable for discharge. He was scheduled for a repeat catheterization for another attempt at revascularization.  Hospital Course     Consultants: None  The patient presented to [facility] again on 01/28/2017.  He was taken to the cath lab where his right femoral artery was accessed and he had successful PCI to the chronic total occlusion of his LAD. He did have a mild amount of oozing post op but after pressure was applied this resolved. She remained stable with no adverse events and was observed overnight. Dr. Irish Lack saw him this morning with the recommendation of aggresssive secondary prevention and DAPT (Aspirin and Brilinta). The patient has been deemed ready for discharge home. And has been advised to follow-up with Dr. Einar Gip. He is going on vacation to [place] this Sunday and has been given very strict instructions on groin site care. A written RX for a 30 day free supply of Brilinta was provided for the patient. A work excuse note was provided as well.  Discharge medications are listed below.  _____________  Discharge Vitals Blood pressure 120/60, pulse 65, temperature 97.7 F (36.5 C), temperature source Oral, resp. rate 20, height 5\' 8"  (1.727 m), weight 169 lb 12.1 oz (77 kg), SpO2 97 %.  Filed Weights   01/27/17 0637 01/27/17 0700 01/28/17 0350  Weight: 170 lb (77.1 kg) 170 lb (77.1 kg) 169 lb 12.1 oz (77 kg)    Labs & Radiologic Studies     CBC  Recent Labs  01/27/17 0713 01/28/17 0303  WBC 6.0 7.5  HGB 12.4* 10.1*  HCT 38.2* 30.8*  MCV 84.5 83.2  PLT 176 250   Basic Metabolic Panel  Recent Labs  01/27/17 0713 01/28/17 0303  NA 139 138  K 3.6 4.0  CL 106 106  CO2 26 26  GLUCOSE 101* 111*  BUN 23* 17  CREATININE 0.99 1.03  CALCIUM 8.7* 8.6*    No results found.   Diagnostic Studies/Procedures    Cardiac catheterization -- Dr. Irish Lack 01/27/2017  Procedures   Coronary Stent Intervention  Coronary CTO Intervention  Left Heart Cath and Coronary Angiography  Conclusion     Patent stent in the the left circumflex system.  Prox LAD to Mid LAD lesion, 100 %stenosed, right to left collaterals.  Successful CTO PCI with a SYNERGY DES 3.5X38 drug eluting stent which was successfully placed, postdilated to > 4 mm.  Post intervention, there is a 0% residual stenosis.  LV end diastolic pressure is normal.  There is no aortic  valve stenosis.   Continue dual antiplatelet therapy for now.  Synergy stent was chosen given his fairly recent GI bleeding.  Will longterm antiplatelet strategy to Dr. Einar Gip.    Watch overnight.    Plan discharge in AM.        Cardiac catheterization -- Dr. Einar Gip 12/15/2016  Coronary Stent Intervention  Left Heart Cath and Coronary Angiography  Conclusion   Coronary angiogram 12/15/2016: Normal LV systolic function, EF 09-38%. No wall motion abnormality. Proximal LAD 80-90% stenosis followed by CTO. Faintly fills by antegrade micro channels. There is  contralateral collaterals from the right coronary artery which is nondominant. Circumflex coronary artery is large and dominant, distal circumflex has a high-grade 90% stenosis.  Interventional data: Unsuccessful attempt at CTO proximal LAD. Successful PTCA and stenting of the distal circumflex with 3.0 x 20 mm Synergy, stenosis reduced from 90% to 0% with maintenance of TIMI-3 to TIMI-3 flow.  Recommendation: Patient will need attempt at angioplasty to the CTO proximal LAD. Although symptoms are minimal, he has a very large area of jeopardy. I'll also discuss with Dr. Lacie Draft. He will need continued dual antiplatelet therapy for at least one year. 225 mL contrast utilized.     _____________    Disposition   Pt is being discharged home today in good condition.  Follow-up Plans & Appointments    Follow-up Information    Adrian Prows, MD. Call.   Specialty:  Cardiology Why:  to arrange a follow-up appointment today or tomorrow to be seen right away when you get back from vacation. Contact information: [address] [place] [address] [phone]            Discharge Medications   Allergies as of 01/28/2017   No Known Allergies     Medication List    STOP taking these medications   prasugrel 10 MG Tabs tablet Commonly known as:  EFFIENT     TAKE these medications   amLODipine 5 MG tablet Commonly known as:  NORVASC Take 5 mg by mouth at bedtime.   aspirin 81 MG tablet Take 81 mg by mouth daily.   fenofibrate 54 MG tablet TAKE 1 TABLET BY MOUTH  DAILY   metoprolol succinate 25 MG 24 hr tablet Commonly known as:  TOPROL-XL Take 25 mg by mouth daily.   multivitamin with minerals tablet Take 1 tablet by mouth daily.   nitroGLYCERIN 0.4 MG SL tablet Commonly known as:  NITROSTAT Place 1 tablet (0.4 mg total) under the tongue every 5 (five) minutes as needed for chest pain.   OMEGA-3 + VITAMIN D3 PO Take 1 capsule by mouth daily.     pantoprazole 40 MG tablet Commonly known as:  PROTONIX Take 40 mg by mouth daily.   tadalafil 5 MG tablet Commonly known as:  CIALIS Take 1 tablet (5 mg total) by mouth daily as needed for erectile dysfunction.   ticagrelor 90 MG Tabs tablet Commonly known as:  BRILINTA Take 1 tablet (90 mg total) by mouth 2 (two) times daily. What changed:  You were already taking a medication with the same name, and this prescription was added. Make sure you understand how and when to take each.   BRILINTA 90 MG Tabs tablet Generic drug:  ticagrelor Take 1 tablet (90 mg total) by mouth 2 (two) times daily. What changed:  Another medication with the same name was added. Make sure you understand how and when to take each.   ticagrelor 90 MG Tabs  tablet Commonly known as:  BRILINTA Take 1 tablet (90 mg total) by mouth 2 (two) times daily. What changed:  You were already taking a medication with the same name, and this prescription was added. Make sure you understand how and when to take each.   VITAMIN B-12 PO Take 1 tablet by mouth daily.   VITAMIN D3 PO Take 1 tablet by mouth daily.           Outstanding Labs/Studies   None  Duration of Discharge Encounter   Greater than 30 minutes including physician time.  Kristopher Glee PA-C 01/28/2017, 2:50 PM   I have examined the patient and reviewed assessment and plan and discussed with patient.  Agree with above as stated.  Please see my note from earlier today.  Groins stable with palpable distal pulse.  Plan discharge today.  I stressed the importance of no lifting more than 10 lbs for a week.  Larae Grooms

## 2017-01-28 NOTE — Progress Notes (Signed)
Pt walked 1000 ft with his wife. No problems or c/o. Feels well. Ed reviewed, he is very knowledgeable about diet and ex. He is going to [place] with his wife on Sunday. Discussed restrictions on groins in depth. He is awaiting CRPII, will update [place]. Understands importance of Brilinta. [place] CES, ACSM 8:37 AM 01/28/2017

## 2017-01-28 NOTE — Progress Notes (Signed)
Pt assisted to bathroom by wife at about 0315. He held R groin upon getting OOB as instructed, and again upon getting back in to bed. After lab tech finished drawing labs, I went in to obtain his vital signs and check groin sites. Left groin remains a level 1 but when I checked R groin I found a small  Palpable hematoma approximately 2cm x2cm. Manual pressure applied for 24 minutes. Area softer.Pressure dressing applied. Pt instructed that he is now back on strict bedrest.Will continue to monitor pt.

## 2017-01-31 DIAGNOSIS — S301XXA Contusion of abdominal wall, initial encounter: Secondary | ICD-10-CM | POA: Diagnosis not present

## 2017-01-31 DIAGNOSIS — R1030 Lower abdominal pain, unspecified: Secondary | ICD-10-CM | POA: Diagnosis not present

## 2017-01-31 DIAGNOSIS — R1909 Other intra-abdominal and pelvic swelling, mass and lump: Secondary | ICD-10-CM | POA: Diagnosis not present

## 2017-02-08 DIAGNOSIS — I25119 Atherosclerotic heart disease of native coronary artery with unspecified angina pectoris: Secondary | ICD-10-CM | POA: Diagnosis not present

## 2017-02-08 DIAGNOSIS — I209 Angina pectoris, unspecified: Secondary | ICD-10-CM | POA: Diagnosis not present

## 2017-02-08 DIAGNOSIS — R0989 Other specified symptoms and signs involving the circulatory and respiratory systems: Secondary | ICD-10-CM | POA: Diagnosis not present

## 2017-03-10 ENCOUNTER — Telehealth (HOSPITAL_COMMUNITY): Payer: Self-pay

## 2017-03-10 NOTE — Telephone Encounter (Signed)
I called and left message on patient voicemail to call office about scheduling for cardiac rehab. I left office contact information on voicemail to return call.

## 2017-03-11 ENCOUNTER — Encounter (HOSPITAL_COMMUNITY): Payer: Self-pay

## 2017-03-17 ENCOUNTER — Telehealth (HOSPITAL_COMMUNITY): Payer: Self-pay

## 2017-03-17 NOTE — Telephone Encounter (Signed)
I called and left message on patient voicemail to call office about scheduling for cardiac rehab. I left office contact information on patient voicemail to return call.  ° °

## 2017-07-29 ENCOUNTER — Encounter: Payer: Self-pay | Admitting: Podiatry

## 2017-07-29 ENCOUNTER — Ambulatory Visit (INDEPENDENT_AMBULATORY_CARE_PROVIDER_SITE_OTHER): Payer: Medicare Other

## 2017-07-29 ENCOUNTER — Ambulatory Visit: Payer: Medicare Other | Admitting: Podiatry

## 2017-07-29 VITALS — BP 187/84 | HR 53 | Resp 16

## 2017-07-29 DIAGNOSIS — M778 Other enthesopathies, not elsewhere classified: Secondary | ICD-10-CM

## 2017-07-29 DIAGNOSIS — M7750 Other enthesopathy of unspecified foot: Secondary | ICD-10-CM

## 2017-07-29 DIAGNOSIS — M722 Plantar fascial fibromatosis: Secondary | ICD-10-CM

## 2017-07-29 DIAGNOSIS — M779 Enthesopathy, unspecified: Principal | ICD-10-CM

## 2017-07-29 MED ORDER — METHYLPREDNISOLONE 4 MG PO TBPK: ORAL_TABLET | ORAL | 0 refills | Status: DC

## 2017-07-29 NOTE — Progress Notes (Signed)
Subjective:  Patient ID: Jose Dyer, male    DOB: [birth year],  MRN: [id number] HPI Chief Complaint  Patient presents with  . Foot Pain    Plantar forefoot and posterior heel bilateral - aching x 6-7 months, AM pain, active in exercise, saw podiatrist and she said to stop exercising and take Aleve-Aleve caused GI upset taking long term     66 y.o. male presents with the above complaint.     Past Medical History:  Diagnosis Date  . Anginal pain ([place])   . Corneal scarring   . Coronary artery disease   . Dyslipidemia   . FHx: cardiovascular disease   . GERD (gastroesophageal reflux disease)   . History of hiatal hernia   . History of kidney stones    "none since the 1990s" (01/27/2017)  . Hypertension   . Stroke [facility]) 2014   denies residual on 01/27/2017   Past Surgical History:  Procedure Laterality Date  . COLONOSCOPY  2004  . CORONARY ANGIOPLASTY WITH STENT PLACEMENT  01/27/2017  . CORONARY CTO INTERVENTION N/A 01/27/2017   Procedure: Coronary CTO Intervention;  Surgeon: Jettie Booze, MD;  Location: [place] [facility];  Service: Cardiovascular;  Laterality: N/A;  . CORONARY STENT INTERVENTION  12/15/2016    Successful PTCA and stenting of the distal circumflex with 3.0 x 20 mm Synergy, stenosis reduced from 90% to 0% with maintenance of TIMI-3 to TIMI-3 flow.  . CORONARY STENT INTERVENTION N/A 12/15/2016   Procedure: Coronary Stent Intervention;  Surgeon: Adrian Prows, MD;  Location: [place] [facility];  Service: Cardiovascular;  Laterality: N/A;  LAD  . CORONARY STENT INTERVENTION N/A 01/27/2017   Procedure: Coronary Stent Intervention;  Surgeon: Jettie Booze, MD;  Location: [place] [facility];  Service: Cardiovascular;  Laterality: N/A;  . LEFT HEART CATH AND CORONARY ANGIOGRAPHY N/A 12/15/2016   Procedure: Left Heart Cath and Coronary Angiography;  Surgeon: Adrian Prows, MD;  Location: [place] [facility];  Service: Cardiovascular;  Laterality: N/A;  . LEFT HEART CATH  AND CORONARY ANGIOGRAPHY N/A 01/27/2017   Procedure: Left Heart Cath and Coronary Angiography;  Surgeon: Jettie Booze, MD;  Location: [place] [facility];  Service: Cardiovascular;  Laterality: N/A;    Current Outpatient Medications:  .  amLODipine (NORVASC) 5 MG tablet, Take 5 mg by mouth at bedtime., Disp: , Rfl:  .  aspirin 81 MG tablet, Take 81 mg by mouth daily., Disp: , Rfl:  .  BRILINTA 90 MG TABS tablet, Take 1 tablet (90 mg total) by mouth 2 (two) times daily., Disp: 60 tablet, Rfl: 0 .  Cholecalciferol (VITAMIN D3 PO), Take 1 tablet by mouth daily., Disp: , Rfl:  .  Cyanocobalamin (VITAMIN B-12 PO), Take 1 tablet by mouth daily., Disp: , Rfl:  .  fenofibrate 54 MG tablet, TAKE 1 TABLET BY MOUTH  DAILY, Disp: 90 tablet, Rfl: 3 .  Fish Oil-Cholecalciferol (OMEGA-3 + VITAMIN D3 PO), Take 1 capsule by mouth daily., Disp: , Rfl:  .  metoprolol succinate (TOPROL-XL) 25 MG 24 hr tablet, Take 25 mg by mouth daily., Disp: , Rfl:  .  Multiple Vitamins-Minerals (MULTIVITAMIN WITH MINERALS) tablet, Take 1 tablet by mouth daily., Disp: , Rfl:  .  nitroGLYCERIN (NITROSTAT) 0.4 MG SL tablet, Place 1 tablet (0.4 mg total) under the tongue every 5 (five) minutes as needed for chest pain., Disp: 25 tablet, Rfl: 12 .  pantoprazole (PROTONIX) 40 MG tablet, Take 40 mg by mouth daily., Disp: ,  Rfl:  .  tadalafil (CIALIS) 5 MG tablet, Take 1 tablet (5 mg total) by mouth daily as needed for erectile dysfunction. (Patient not taking: Reported on 12/10/2016), Disp: 30 tablet, Rfl: 0 .  ticagrelor (BRILINTA) 90 MG TABS tablet, Take 1 tablet (90 mg total) by mouth 2 (two) times daily., Disp: 60 tablet, Rfl: 0 .  ticagrelor (BRILINTA) 90 MG TABS tablet, Take 1 tablet (90 mg total) by mouth 2 (two) times daily., Disp: 180 tablet, Rfl: 3  No Known Allergies Review of Systems  All other systems reviewed and are negative.  Objective:   Vitals:   07/29/17 0923  BP: (!) 187/84  Pulse: (!) 53  Resp: 16     General: Well developed, nourished, in no acute distress, alert and oriented x3   Dermatological: Skin is warm, dry and supple bilateral. Nails x 10 are well maintained; remaining integument appears unremarkable at this time. There are no open sores, no preulcerative lesions, no rash or signs of infection present.  Vascular: Dorsalis Pedis artery and Posterior Tibial artery pedal pulses are 2/4 bilateral with immedate capillary fill time. Pedal hair growth present. No varicosities and no lower extremity edema present bilateral.   Neruologic: Grossly intact via light touch bilateral. Vibratory intact via tuning fork bilateral. Protective threshold with Semmes Wienstein monofilament intact to all pedal sites bilateral. Patellar and Achilles deep tendon reflexes 2+ bilateral. No Babinski or clonus noted bilateral.   Musculoskeletal: No gross boney pedal deformities bilateral. No pain, crepitus, or limitation noted with foot and ankle range of motion bilateral. Muscular strength 5/5 in all groups tested bilateral.  Gait: Unassisted, Nonantalgic.    Radiographs:  Radiographs 3 views bilateral foot taken today demonstrate an osseously mature individual with plantar distally oriented calcaneal heel spurs and a soft tissue increase in density at the plantar fascial calcaneal insertion site.  Both lateral radiographs also demonstrated thickening of the Achilles tendinous insertion site of the posterior aspect of the calcaneus.  Assessment & Plan:   Assessment: Primary plantar fasciitis bilateral.  Mild Achilles tendinitis bilateral.  Plan: We discussed etiology pathology conservative versus surgical therapies.  Discussed appropriate shoe gear stretching exercises ice therapy and shoe gear modifications.  He was provided with both oral and written home-going instructions for stretching exercises.  After verbal consent and sterile Betadine skin prep we injected the plantar fascial calcaneal insertion  site along the medial aspect of each foot.  This was performed with 20 mg of Kenalog and 5 mg of Marcaine to the point of maximal tenderness.  He tolerated procedure well without complications.  We dispensed plantar fascial braces bilaterally and he has a night splint at home that he will continue to wear.  He will be leaving for [place] and will not return until March.  I will follow-up with him in 1 month before he leaves if necessary.  He is leaving for [facility] in the next week or so.  I did dispense a Medrol Dosepak no NSAIDs were used because of his history of coronary artery disease.     Max T. [place], [place]

## 2017-07-29 NOTE — Patient Instructions (Signed)

## 2017-08-16 DIAGNOSIS — E785 Hyperlipidemia, unspecified: Secondary | ICD-10-CM | POA: Diagnosis not present

## 2017-08-16 DIAGNOSIS — I25119 Atherosclerotic heart disease of native coronary artery with unspecified angina pectoris: Secondary | ICD-10-CM | POA: Diagnosis not present

## 2017-08-16 DIAGNOSIS — I209 Angina pectoris, unspecified: Secondary | ICD-10-CM | POA: Diagnosis not present

## 2017-08-16 DIAGNOSIS — Z789 Other specified health status: Secondary | ICD-10-CM | POA: Diagnosis not present

## 2017-08-23 DIAGNOSIS — E785 Hyperlipidemia, unspecified: Secondary | ICD-10-CM | POA: Diagnosis not present

## 2017-09-07 ENCOUNTER — Encounter: Payer: Self-pay | Admitting: Family Medicine

## 2017-09-07 ENCOUNTER — Telehealth: Payer: Self-pay | Admitting: Family Medicine

## 2017-09-07 NOTE — Telephone Encounter (Signed)
Called pt and left message to call back and schedule an appointment to see Dr Redmond School. Per Dr Redmond School he would like to see pt since recent labs were done by Dr Einar Gip and Dr Redmond School has not seen pt is a while.

## 2017-09-24 ENCOUNTER — Encounter (HOSPITAL_COMMUNITY): Payer: Self-pay | Admitting: Emergency Medicine

## 2017-09-24 ENCOUNTER — Ambulatory Visit (HOSPITAL_COMMUNITY)
Admission: EM | Admit: 2017-09-24 | Discharge: 2017-09-24 | Disposition: A | Discharge status: Home / Self Care | Payer: Medicare Other | Attending: Emergency Medicine | Admitting: Emergency Medicine

## 2017-09-24 DIAGNOSIS — J029 Acute pharyngitis, unspecified: Secondary | ICD-10-CM

## 2017-09-24 DIAGNOSIS — R059 Cough, unspecified: Secondary | ICD-10-CM

## 2017-09-24 DIAGNOSIS — R05 Cough: Secondary | ICD-10-CM | POA: Diagnosis not present

## 2017-09-24 MED ORDER — KETOROLAC TROMETHAMINE 30 MG/ML IJ SOLN: 30.0000 mg | Freq: Once | INTRAMUSCULAR | Status: DC

## 2017-09-24 MED ORDER — CETIRIZINE-PSEUDOEPHEDRINE ER 5-120 MG PO TB12: 1.0000 | ORAL_TABLET | Freq: Every day | ORAL | 0 refills | Status: DC

## 2017-09-24 MED ORDER — KETOROLAC TROMETHAMINE 30 MG/ML IJ SOLN
INTRAMUSCULAR | Status: AC
  Filled 2017-09-24: qty 1

## 2017-09-24 MED ORDER — NAPROXEN 500 MG PO TABS: 500.0000 mg | ORAL_TABLET | Freq: Two times a day (BID) | ORAL | 0 refills | Status: DC

## 2017-09-24 MED ORDER — PREDNISONE 10 MG (21) PO TBPK: ORAL_TABLET | Freq: Every day | ORAL | 0 refills | Status: DC

## 2017-09-24 NOTE — ED Triage Notes (Signed)
PT C/O: c/o sore throat onset 1 week associated w/dysphagia, cough, nasal congestion  DENIES: fevers  TAKING MEDS: none   A&O x4... NAD... Ambulatory

## 2017-09-24 NOTE — ED Provider Notes (Signed)
[place]    CSN: [id number] Arrival date & time: 09/24/17  1559     History   Chief Complaint Chief Complaint  Patient presents with  . Sore Throat    HPI Jose Dyer is a 67 y.o. male.   Cough, sore throat, for the past week. No fever, no n/v/d  States that he has not been taking anything for this.       Past Medical History:  Diagnosis Date  . Anginal pain ([place])   . Corneal scarring   . Coronary artery disease   . Dyslipidemia   . FHx: cardiovascular disease   . GERD (gastroesophageal reflux disease)   . History of hiatal hernia   . History of kidney stones    "none since the 1990s" (01/27/2017)  . Hypertension   . Stroke [facility]) 2014   denies residual on 01/27/2017    Patient Active Problem List   Diagnosis Date Noted  . Coronary artery disease: Cath a. (May 2018) succ PTCA of distal circ, unsucc at CTO prox LAD. b. (June 2018) successful CTO of prox to mid LAD  01/28/2017  . Post PTCA 12/15/2016  . Angina pectoris ([place]) 12/14/2016  . Corneal scarring 12/16/2015  . Drug-induced erectile dysfunction 12/16/2015  . Statin intolerance 06/28/2014  . CVA (cerebral infarction) 01/30/2014  . Hyperlipidemia LDL goal <100 01/30/2014  . Hypertension 12/15/2012    Past Surgical History:  Procedure Laterality Date  . COLONOSCOPY  2004  . CORONARY ANGIOPLASTY WITH STENT PLACEMENT  01/27/2017  . CORONARY CTO INTERVENTION N/A 01/27/2017   Procedure: Coronary CTO Intervention;  Surgeon: Jettie Booze, MD;  Location: [place] [facility];  Service: Cardiovascular;  Laterality: N/A;  . CORONARY STENT INTERVENTION  12/15/2016    Successful PTCA and stenting of the distal circumflex with 3.0 x 20 mm Synergy, stenosis reduced from 90% to 0% with maintenance of TIMI-3 to TIMI-3 flow.  . CORONARY STENT INTERVENTION N/A 12/15/2016   Procedure: Coronary Stent Intervention;  Surgeon: Adrian Prows, MD;  Location: [place] [facility];  Service: Cardiovascular;   Laterality: N/A;  LAD  . CORONARY STENT INTERVENTION N/A 01/27/2017   Procedure: Coronary Stent Intervention;  Surgeon: Jettie Booze, MD;  Location: [place] [facility];  Service: Cardiovascular;  Laterality: N/A;  . LEFT HEART CATH AND CORONARY ANGIOGRAPHY N/A 12/15/2016   Procedure: Left Heart Cath and Coronary Angiography;  Surgeon: Adrian Prows, MD;  Location: [place] [facility];  Service: Cardiovascular;  Laterality: N/A;  . LEFT HEART CATH AND CORONARY ANGIOGRAPHY N/A 01/27/2017   Procedure: Left Heart Cath and Coronary Angiography;  Surgeon: Jettie Booze, MD;  Location: [place] [facility];  Service: Cardiovascular;  Laterality: N/A;       Home Medications    Prior to Admission medications   Medication Sig Start Date End Date Taking? Authorizing Provider  amLODipine (NORVASC) 5 MG tablet Take 5 mg by mouth at bedtime. 11/04/16  Yes [provider]  aspirin 81 MG tablet Take 81 mg by mouth daily.   Yes [provider]  BRILINTA 90 MG TABS tablet Take 1 tablet (90 mg total) by mouth 2 (two) times daily. 01/28/17  Yes Carlota Raspberry, Tiffany, PA-C  Cholecalciferol (VITAMIN D3 PO) Take 1 tablet by mouth daily.   Yes [provider]  Cyanocobalamin (VITAMIN B-12 PO) Take 1 tablet by mouth daily.   Yes [provider]  fenofibrate 54 MG tablet TAKE 1 TABLET BY MOUTH  DAILY 11/04/16  Yes Denita Lung, MD  Fish Oil-Cholecalciferol (OMEGA-3 + VITAMIN D3 PO) Take 1 capsule by mouth daily.   Yes [provider]  pantoprazole (PROTONIX) 40 MG tablet Take 40 mg by mouth daily.   Yes [provider]  ticagrelor (BRILINTA) 90 MG TABS tablet Take 1 tablet (90 mg total) by mouth 2 (two) times daily. 01/28/17  Yes Carlota Raspberry, Tiffany, PA-C  cetirizine-pseudoephedrine (ZYRTEC-D) 5-120 MG tablet Take 1 tablet by mouth daily. 09/24/17   Marney Setting, NP  methylPREDNISolone (MEDROL DOSEPAK) 4 MG TBPK tablet 6 day dose pack - take as directed 07/29/17    Hyatt, Max T, DPM  metoprolol succinate (TOPROL-XL) 25 MG 24 hr tablet Take 25 mg by mouth daily. 10/13/16   [provider]  Multiple Vitamins-Minerals (MULTIVITAMIN WITH MINERALS) tablet Take 1 tablet by mouth daily.    [provider]  naproxen (NAPROSYN) 500 MG tablet Take 1 tablet (500 mg total) by mouth 2 (two) times daily. 09/24/17   Marney Setting, NP  nitroGLYCERIN (NITROSTAT) 0.4 MG SL tablet Place 1 tablet (0.4 mg total) under the tongue every 5 (five) minutes as needed for chest pain. 01/28/17   Delos Haring, PA-C  predniSONE (STERAPRED UNI-PAK 21 TAB) 10 MG (21) TBPK tablet Take by mouth daily. Take 6 tabs by mouth daily  for 2 days, then 5 tabs for 2 days, then 4 tabs for 2 days, then 3 tabs for 2 days, 2 tabs for 2 days, then 1 tab by mouth daily for 2 days 09/24/17   Marney Setting, NP  tadalafil (CIALIS) 5 MG tablet Take 1 tablet (5 mg total) by mouth daily as needed for erectile dysfunction. Patient not taking: Reported on 12/10/2016 12/16/15   Denita Lung, MD  ticagrelor (BRILINTA) 90 MG TABS tablet Take 1 tablet (90 mg total) by mouth 2 (two) times daily. 01/28/17   Delos Haring, PA-C    Family History Family History  Problem Relation Age of Onset  . Colon cancer Neg Hx   . Esophageal cancer Neg Hx   . Rectal cancer Neg Hx   . Stomach cancer Neg Hx     Social History Social History   Tobacco Use  . Smoking status: Never Smoker  . Smokeless tobacco: Never Used  Substance Use Topics  . Alcohol use: No  . Drug use: No     Allergies   Patient has no known allergies.   Review of Systems Review of Systems  Constitutional: Negative.   HENT: Positive for congestion and postnasal drip.   Respiratory: Positive for cough.   Cardiovascular: Negative.   Gastrointestinal: Negative.   Genitourinary: Negative.   Neurological: Negative.      Physical Exam Triage Vital Signs ED Triage Vitals  Enc Vitals Group     BP --      Pulse Rate  09/24/17 1727 (!) 49     Resp 09/24/17 1716 16     Temp 09/24/17 1727 98.8 F (37.1 C)     Temp Source 09/24/17 1727 Oral     SpO2 09/24/17 1727 100 %     Weight --      Height --      Head Circumference --      Peak Flow --      Pain Score 09/24/17 1716 7     Pain Loc --      Pain Edu? --      Excl. in GC? --    No  data found.  Updated Vital Signs Pulse (!) 49   Temp 98.8 F (37.1 C) (Oral)   Resp 16   SpO2 100%   Visual Acuity Right Eye Distance:   Left Eye Distance:   Bilateral Distance:    Right Eye Near:   Left Eye Near:    Bilateral Near:     Physical Exam  Constitutional: He appears well-developed.  HENT:  Head: Normocephalic.  Right Ear: Hearing and ear canal normal.  Left Ear: Ear canal normal.  Mouth/Throat: Oropharynx is clear and moist and mucous membranes are normal.  TM has small fluid line   Eyes: Pupils are equal, round, and reactive to light.  Neck: Normal range of motion.  Cardiovascular: Normal rate and normal heart sounds.  Pulmonary/Chest: Effort normal.  Abdominal: Soft.  Neurological: He is alert.  Skin: Skin is warm.     UC Treatments / Results  Labs (all labs ordered are listed, but only abnormal results are displayed) Labs Reviewed - No data to display  EKG  EKG Interpretation None       Radiology No results found.  Procedures Procedures (including critical care time)  Medications Ordered in UC Medications  ketorolac (TORADOL) 30 MG/ML injection 30 mg (not administered)     Initial Impression / Assessment and Plan / UC Course  I have reviewed the triage vital signs and the nursing notes.  Pertinent labs & imaging results that were available during my care of the patient were reviewed by me and considered in my medical decision making (see chart for details).     May use humidifier as needed or at night Push fluids It appears that you have something more viral than bacterial in nature.   Final Clinical  Impressions(s) / UC Diagnoses   Final diagnoses:  Viral pharyngitis  Cough  Sore throat    ED Discharge Orders        Ordered    predniSONE (STERAPRED UNI-PAK 21 TAB) 10 MG (21) TBPK tablet  Daily     09/24/17 1808    naproxen (NAPROSYN) 500 MG tablet  2 times daily     09/24/17 1808    cetirizine-pseudoephedrine (ZYRTEC-D) 5-120 MG tablet  Daily     09/24/17 1808       Controlled Substance Prescriptions [place] Controlled Substance Registry consulted? Not Applicable   Marney Setting, NP 09/27/17 1002

## 2017-09-24 NOTE — Discharge Instructions (Signed)
May use humidifier as needed or at night Push fluids It appears that you have something more viral than bacterial in nature.

## 2017-09-28 ENCOUNTER — Ambulatory Visit: Payer: Medicare Other | Admitting: Podiatry

## 2017-11-11 ENCOUNTER — Ambulatory Visit: Payer: Medicare Other | Admitting: Podiatry

## 2018-02-21 LAB — IFOBT (OCCULT BLOOD): IFOBT: POSITIVE

## 2018-05-16 ENCOUNTER — Encounter (HOSPITAL_COMMUNITY): Payer: Self-pay | Admitting: Emergency Medicine

## 2018-05-16 ENCOUNTER — Ambulatory Visit (HOSPITAL_COMMUNITY)
Admission: EM | Admit: 2018-05-16 | Discharge: 2018-05-16 | Disposition: A | Discharge status: Home / Self Care | Payer: Medicare Other | Attending: Family Medicine | Admitting: Family Medicine

## 2018-05-16 DIAGNOSIS — Z7902 Long term (current) use of antithrombotics/antiplatelets: Secondary | ICD-10-CM | POA: Diagnosis not present

## 2018-05-16 DIAGNOSIS — S61012A Laceration without foreign body of left thumb without damage to nail, initial encounter: Secondary | ICD-10-CM | POA: Diagnosis not present

## 2018-05-16 DIAGNOSIS — W260XXA Contact with knife, initial encounter: Secondary | ICD-10-CM | POA: Diagnosis not present

## 2018-05-16 NOTE — ED Provider Notes (Signed)
[place]    CSN: [id number] Arrival date & time: 05/16/18  1608     History   Chief Complaint Chief Complaint  Patient presents with  . Extremity Laceration    HPI Jose Dyer is a 67 y.o. male.   About 2 hours prior to admission patient cut his left thumb with carpet knife.  He is on Brilinta having had stents placed within the last year.  It has been bleeding and will not stop.  HPI  Past Medical History:  Diagnosis Date  . Anginal pain ([place])   . Corneal scarring   . Coronary artery disease   . Dyslipidemia   . FHx: cardiovascular disease   . GERD (gastroesophageal reflux disease)   . History of hiatal hernia   . History of kidney stones    "none since the 1990s" (01/27/2017)  . Hypertension   . Stroke [facility]) 2014   denies residual on 01/27/2017    Patient Active Problem List   Diagnosis Date Noted  . Coronary artery disease: Cath a. (May 2018) succ PTCA of distal circ, unsucc at CTO prox LAD. b. (June 2018) successful CTO of prox to mid LAD  01/28/2017  . Post PTCA 12/15/2016  . Angina pectoris ([place]) 12/14/2016  . Corneal scarring 12/16/2015  . Drug-induced erectile dysfunction 12/16/2015  . Statin intolerance 06/28/2014  . CVA (cerebral infarction) 01/30/2014  . Hyperlipidemia LDL goal <100 01/30/2014  . Hypertension 12/15/2012    Past Surgical History:  Procedure Laterality Date  . COLONOSCOPY  2004  . CORONARY ANGIOPLASTY WITH STENT PLACEMENT  01/27/2017  . CORONARY CTO INTERVENTION N/A 01/27/2017   Procedure: Coronary CTO Intervention;  Surgeon: Jettie Booze, MD;  Location: [facility];  Service: Cardiovascular;  Laterality: N/A;  . CORONARY STENT INTERVENTION  12/15/2016    Successful PTCA and stenting of the distal circumflex with 3.0 x 20 mm Synergy, stenosis reduced from 90% to 0% with maintenance of TIMI-3 to TIMI-3 flow.  . CORONARY STENT INTERVENTION N/A 12/15/2016   Procedure: Coronary Stent Intervention;  Surgeon: Adrian Prows, MD;  Location: [facility];  Service: Cardiovascular;  Laterality: N/A;  LAD  . CORONARY STENT INTERVENTION N/A 01/27/2017   Procedure: Coronary Stent Intervention;  Surgeon: Jettie Booze, MD;  Location: [facility];  Service: Cardiovascular;  Laterality: N/A;  . LEFT HEART CATH AND CORONARY ANGIOGRAPHY N/A 12/15/2016   Procedure: Left Heart Cath and Coronary Angiography;  Surgeon: Adrian Prows, MD;  Location: [facility];  Service: Cardiovascular;  Laterality: N/A;  . LEFT HEART CATH AND CORONARY ANGIOGRAPHY N/A 01/27/2017   Procedure: Left Heart Cath and Coronary Angiography;  Surgeon: Jettie Booze, MD;  Location: [facility];  Service: Cardiovascular;  Laterality: N/A;       Home Medications    Prior to Admission medications   Medication Sig Start Date End Date Taking? Authorizing Provider  amLODipine (NORVASC) 5 MG tablet Take 5 mg by mouth at bedtime. 11/04/16   [provider]  aspirin 81 MG tablet Take 81 mg by mouth daily.    [provider]  BRILINTA 90 MG TABS tablet Take 1 tablet (90 mg total) by mouth 2 (two) times daily. 01/28/17   Delos Haring, PA-C  cetirizine-pseudoephedrine (ZYRTEC-D) 5-120 MG tablet Take 1 tablet by mouth daily. 09/24/17   Marney Setting, NP  Cholecalciferol (VITAMIN D3 PO) Take 1 tablet by mouth daily.    [provider]  Cyanocobalamin (VITAMIN B-12 PO) Take 1 tablet by mouth daily.    [provider]  fenofibrate 54 MG tablet TAKE 1 TABLET BY MOUTH  DAILY 11/04/16   Denita Lung, MD  Fish Oil-Cholecalciferol (OMEGA-3 + VITAMIN D3 PO) Take 1 capsule by mouth daily.    [provider]  methylPREDNISolone (MEDROL DOSEPAK) 4 MG TBPK tablet 6 day dose pack - take as directed 07/29/17   Hyatt, Max T, DPM  metoprolol succinate (TOPROL-XL) 25 MG 24 hr tablet Take 25 mg by mouth daily. 10/13/16   [provider]  Multiple Vitamins-Minerals (MULTIVITAMIN WITH  MINERALS) tablet Take 1 tablet by mouth daily.    [provider]  naproxen (NAPROSYN) 500 MG tablet Take 1 tablet (500 mg total) by mouth 2 (two) times daily. 09/24/17   Marney Setting, NP  nitroGLYCERIN (NITROSTAT) 0.4 MG SL tablet Place 1 tablet (0.4 mg total) under the tongue every 5 (five) minutes as needed for chest pain. 01/28/17   Delos Haring, PA-C  pantoprazole (PROTONIX) 40 MG tablet Take 40 mg by mouth daily.    [provider]  predniSONE (STERAPRED UNI-PAK 21 TAB) 10 MG (21) TBPK tablet Take by mouth daily. Take 6 tabs by mouth daily  for 2 days, then 5 tabs for 2 days, then 4 tabs for 2 days, then 3 tabs for 2 days, 2 tabs for 2 days, then 1 tab by mouth daily for 2 days 09/24/17   Marney Setting, NP  tadalafil (CIALIS) 5 MG tablet Take 1 tablet (5 mg total) by mouth daily as needed for erectile dysfunction. Patient not taking: Reported on 12/10/2016 12/16/15   Denita Lung, MD  ticagrelor (BRILINTA) 90 MG TABS tablet Take 1 tablet (90 mg total) by mouth 2 (two) times daily. Patient taking differently: Take 60 mg by mouth 2 (two) times daily.  01/28/17   Delos Haring, PA-C  ticagrelor (BRILINTA) 90 MG TABS tablet Take 1 tablet (90 mg total) by mouth 2 (two) times daily. 01/28/17   Delos Haring, PA-C    Family History Family History  Problem Relation Age of Onset  . Colon cancer Neg Hx   . Esophageal cancer Neg Hx   . Rectal cancer Neg Hx   . Stomach cancer Neg Hx     Social History Social History   Tobacco Use  . Smoking status: Never Smoker  . Smokeless tobacco: Never Used  Substance Use Topics  . Alcohol use: No  . Drug use: No     Allergies   Statins   Review of Systems Review of Systems  Constitutional: Negative for chills and fever.  HENT: Negative for ear pain and sore throat.   Eyes: Negative for pain and visual disturbance.  Respiratory: Negative for cough and shortness of breath.   Cardiovascular: Negative for chest pain  and palpitations.  Gastrointestinal: Negative for abdominal pain and vomiting.  Genitourinary: Negative for dysuria and hematuria.  Musculoskeletal: Negative for arthralgias and back pain.  Skin: Positive for wound. Negative for color change and rash.  Neurological: Negative for seizures and syncope.  All other systems reviewed and are negative.    Physical Exam Triage Vital Signs ED Triage Vitals  Enc Vitals Group     BP 05/16/18 1727 (!) 143/65     Pulse Rate 05/16/18 1727 (!) 52     Resp 05/16/18 1727 16     Temp 05/16/18 1727 97.9 F (36.6 C)     Temp Source 05/16/18  1727 Oral     SpO2 05/16/18 1727 99 %     Weight --      Height --      Head Circumference --      Peak Flow --      Pain Score 05/16/18 1728 3     Pain Loc --      Pain Edu? --      Excl. in [place]? --    No data found.  Updated Vital Signs BP (!) 143/65   Pulse (!) 52   Temp 97.9 F (36.6 C) (Oral)   Resp 16   SpO2 99%   Visual Acuity Right Eye Distance:   Left Eye Distance:   Bilateral Distance:    Right Eye Near:   Left Eye Near:    Bilateral Near:     Physical Exam  Skin:  Left thumb laceration was anesthetized with 1% Xylocaine prepped with Betadine and saline and closed with 5-0 nylon with 7 interrupted sutures.  Edges well opposed hemostasis obtained with suture.  Dressing was applied. Instructions given on follow-up in wound care     UC Treatments / Results  Labs (all labs ordered are listed, but only abnormal results are displayed) Labs Reviewed - No data to display  EKG None  Radiology No results found.  Procedures Procedures (including critical care time)  Medications Ordered in UC Medications - No data to display  Initial Impression / Assessment and Plan / UC Course  I have reviewed the triage vital signs and the nursing notes.  Pertinent labs & imaging results that were available during my care of the patient were reviewed by me and considered in my medical  decision making (see chart for details).     Laceration of left thumb, repaired Final Clinical Impressions(s) / UC Diagnoses   Final diagnoses:  None   Discharge Instructions   None    ED Prescriptions    None     Controlled Substance Prescriptions [place] Controlled Substance Registry consulted? No   Wardell Honour, MD 05/16/18 Jeri Lager

## 2018-05-16 NOTE — ED Triage Notes (Signed)
PT cut left thumb with a carpet knife today. TDAP has been given in last 5 years per PT.

## 2018-05-25 ENCOUNTER — Ambulatory Visit: Payer: Medicare Other | Admitting: Gastroenterology

## 2018-05-25 ENCOUNTER — Telehealth: Payer: Self-pay

## 2018-05-25 ENCOUNTER — Encounter: Payer: Self-pay | Admitting: Gastroenterology

## 2018-05-25 VITALS — BP 130/80 | HR 74 | Ht 68.0 in | Wt 169.5 lb

## 2018-05-25 DIAGNOSIS — Z7902 Long term (current) use of antithrombotics/antiplatelets: Secondary | ICD-10-CM

## 2018-05-25 DIAGNOSIS — R195 Other fecal abnormalities: Secondary | ICD-10-CM | POA: Diagnosis not present

## 2018-05-25 MED ORDER — NA SULFATE-K SULFATE-MG SULF 17.5-3.13-1.6 GM/177ML PO SOLN: 1.0000 | Freq: Once | ORAL | 0 refills | Status: AC

## 2018-05-25 NOTE — Progress Notes (Signed)
    History of Present Illness: This is a 67 year old male referred by Dr. Velna Hatchet for evaluation of Hemosure positive stool.  Patient relates occasional small amounts of bright red blood following bowel movements that he attributes to hemorrhoids.  These symptoms have not changed over the past several years and they occur infrequently.  No other gastrointestinal complaints.  He is maintained on Brilinta. Denies weight loss, abdominal pain, constipation, diarrhea, change in stool caliber, melena, hematochezia, nausea, vomiting, dysphagia, reflux symptoms, chest pain.  Colonoscopy 10/2008: diverticulosis, internal hemorrhoids.  EGD 10/2016: variable z-line, gastritis, small HH (path: reflux changes and mild chronic gastritis)  Current Medications, Allergies, Past Medical History, Past Surgical History, Family History and Social History were reviewed in [employer] record.  Physical Exam: General: Well developed, well nourished, no acute distress Head: Normocephalic and atraumatic Eyes:  sclerae anicteric, EOMI Ears: Normal auditory acuity Mouth: No deformity or lesions Lungs: Clear throughout to auscultation Heart: Regular rate and rhythm; no murmurs, rubs or bruits Abdomen: Soft, non tender and non distended. No masses, hepatosplenomegaly or hernias noted. Normal Bowel sounds Rectal: Deferred to colonoscopy Musculoskeletal: Symmetrical with no gross deformities  Pulses:  Normal pulses noted Extremities: No clubbing, cyanosis, edema or deformities noted Neurological: Alert oriented x 4, grossly nonfocal Psychological:  Alert and cooperative. Normal mood and affect   Assessment and Recommendations:  1. Occult blood in stool.  Rule out colorectal neoplasms, hemorrhoids and other disorders.  Schedule colonoscopy. The risks (including bleeding, perforation, infection, missed lesions, medication reactions and possible hospitalization or surgery if complications  occur), benefits, and alternatives to colonoscopy with possible biopsy and possible polypectomy were discussed with the patient and they consent to proceed.   2. Hold Brilinta 7 days before procedure - will instruct when and how to resume after procedure. Low but real risk of cardiovascular event such as heart attack, stroke, embolism, thrombosis or ischemia/infarct of other organs off Brilinta explained and need to seek urgent help if this occurs. The patient consents to proceed. Will communicate by phone or EMR with patient's prescribing provider to confirm that holding Brilinta is reasonable in this case.

## 2018-05-25 NOTE — Telephone Encounter (Signed)
[facility] Pre-operative Risk Assessment     Request for surgical clearance:     Endoscopy Procedure  What type of surgery is being performed?     Colonoscopy  When is this surgery scheduled?     06/07/18  What type of clearance is required ?   Pharmacy  Are there any medications that need to be held prior to surgery and how long? Brilinta x 7 days  Practice name and name of physician performing surgery?      [facility]  What is your office phone and fax number?      Phone- [phone]  [phone]  Anesthesia type (None, local, MAC, general) ?       MAC

## 2018-05-25 NOTE — Patient Instructions (Signed)
You have been scheduled for a colonoscopy. Please follow written instructions given to you at your visit today.  Please pick up your prep supplies at the pharmacy within the next 1-3 days. If you use inhalers (even only as needed), please bring them with you on the day of your procedure. Your physician has requested that you go to www.startemmi.com and enter the access code given to you at your visit today. This web site gives a general overview about your procedure. However, you should still follow specific instructions given to you by our office regarding your preparation for the procedure.  Thank you for choosing me and [facility].  Malcolm T. Stark, Jr., MD., FACG  

## 2018-05-27 NOTE — Telephone Encounter (Signed)
Patient notified to hold Brilinta per Dr. Einar Gip x 6 days prior to Colonoscopy. Patient agreed and verbalized understanding.

## 2018-06-07 ENCOUNTER — Ambulatory Visit (AMBULATORY_SURGERY_CENTER): Payer: Medicare Other | Admitting: Gastroenterology

## 2018-06-07 ENCOUNTER — Encounter: Payer: Self-pay | Admitting: Gastroenterology

## 2018-06-07 VITALS — BP 141/62 | HR 48 | Temp 97.8°F | Resp 16 | Ht 68.0 in | Wt 169.0 lb

## 2018-06-07 DIAGNOSIS — K648 Other hemorrhoids: Secondary | ICD-10-CM | POA: Diagnosis not present

## 2018-06-07 DIAGNOSIS — K5730 Diverticulosis of large intestine without perforation or abscess without bleeding: Secondary | ICD-10-CM

## 2018-06-07 DIAGNOSIS — D123 Benign neoplasm of transverse colon: Secondary | ICD-10-CM

## 2018-06-07 DIAGNOSIS — R195 Other fecal abnormalities: Secondary | ICD-10-CM

## 2018-06-07 MED ORDER — SODIUM CHLORIDE 0.9 % IV SOLN
500.0000 mL | Freq: Once | INTRAVENOUS | Status: DC
Start: 2018-06-07 — End: 2018-06-07

## 2018-06-07 NOTE — Progress Notes (Signed)
Patient has a 4" x 6" bruise on his R buttock.  It was there before the procedure.

## 2018-06-07 NOTE — Op Note (Signed)
[place] Patient Name: Jose Dyer Procedure Date: 06/07/2018 3:26 PM MRN: [id number] Endoscopist: Ladene Artist , MD Age: 67 Referring MD:  Date of Birth: Jan 08, 1951 Gender: Male Account #: [id number] Procedure:                Colonoscopy Indications:              Positive fecal immunochemical test Medicines:                Monitored Anesthesia Care Procedure:                Pre-Anesthesia Assessment:                           - Prior to the procedure, a History and Physical                            was performed, and patient medications and                            allergies were reviewed. The patient's tolerance of                            previous anesthesia was also reviewed. The risks                            and benefits of the procedure and the sedation                            options and risks were discussed with the patient.                            All questions were answered, and informed consent                            was obtained. Prior Anticoagulants: The patient has                            taken Brilinita antiplatelet medication, last dose                            was 7 days prior to procedure. ASA Grade                            Assessment: II - A patient with mild systemic                            disease. After reviewing the risks and benefits,                            the patient was deemed in satisfactory condition to                            undergo the procedure.  After obtaining informed consent, the colonoscope                            was passed under direct vision. Throughout the                            procedure, the patient's blood pressure, pulse, and                            oxygen saturations were monitored continuously. The                            Model PCF-[id number]) scope was introduced                            through the anus and advanced to the the cecum,                             identified by appendiceal orifice and ileocecal                            valve. The ileocecal valve, appendiceal orifice,                            and rectum were photographed. The quality of the                            bowel preparation was good. The colonoscopy was                            performed without difficulty. The patient tolerated                            the procedure well. Scope In: 3:37:16 PM Scope Out: 3:52:03 PM Scope Withdrawal Time: 0 hours 12 minutes 51 seconds  Total Procedure Duration: 0 hours 14 minutes 47 seconds  Findings:                 The perianal and digital rectal examinations were                            normal.                           A 6 mm polyp was found in the transverse colon. The                            polyp was sessile. The polyp was removed with a                            cold snare. Resection and retrieval were complete.                           Two sessile polyps were found in the transverse  colon. The polyps were 4 mm in size. These polyps                            were removed with a cold biopsy forceps. Resection                            and retrieval were complete.                           Scattered small-mouthed diverticula were found in                            the sigmoid colon, descending colon and transverse                            colon. There was no evidence of diverticular                            bleeding.                           Internal hemorrhoids were found during                            retroflexion. The hemorrhoids were small and Grade                            I (internal hemorrhoids that do not prolapse).                           The exam was otherwise without abnormality on                            direct and retroflexion views. Complications:            No immediate complications. Estimated blood loss:                             None. Estimated Blood Loss:     Estimated blood loss: none. Impression:               - One 6 mm polyp in the transverse colon, removed                            with a cold snare. Resected and retrieved.                           - Two 4 mm polyps in the transverse colon, removed                            with a cold biopsy forceps. Resected and retrieved.                           - Mild diverticulosis in the sigmoid colon, in the  descending colon and in the transverse colon.                           - Internal hemorrhoids.                           - The examination was otherwise normal on direct                            and retroflexion views. Recommendation:           - Repeat colonoscopy in 5 years for surveillance if                            polyp(s) are precancerous, otherwise 10 years.                           - Resume previous Brilinita antiplatelet medication                            tomorrow at prior dose. Refer to managing physician                            for further adjustment of therapy.                           - Patient has a contact number available for                            emergencies. The signs and symptoms of potential                            delayed complications were discussed with the                            patient. Return to normal activities tomorrow.                            Written discharge instructions were provided to the                            patient.                           - High fiber diet.                           - Continue present medications.                           - Await pathology results. Ladene Artist, MD 06/07/2018 3:56:53 PM This report has been signed electronically.

## 2018-06-07 NOTE — Progress Notes (Signed)
Pt's states no medical or surgical changes since previsit or office visit. 

## 2018-06-07 NOTE — Patient Instructions (Signed)
HANDOUTS GIVEN FOR POLYPS, DIVERTICULOSIS, HEMORRHOIDS AND HIGH FIBER DIET  RESUME PREVIOUS BRILINTA DOSE TOMORROW.  YOU HAD AN ENDOSCOPIC PROCEDURE TODAY AT [facility]:   Refer to the procedure report that was given to you for any specific questions about what was found during the examination.  If the procedure report does not answer your questions, please call your gastroenterologist to clarify.  If you requested that your care partner not be given the details of your procedure findings, then the procedure report has been included in a sealed envelope for you to review at your convenience later.  YOU SHOULD EXPECT: Some feelings of bloating in the abdomen. Passage of more gas than usual.  Walking can help get rid of the air that was put into your GI tract during the procedure and reduce the bloating. If you had a lower endoscopy (such as a colonoscopy or flexible sigmoidoscopy) you may notice spotting of blood in your stool or on the toilet paper. If you underwent a bowel prep for your procedure, you may not have a normal bowel movement for a few days.  Please Note:  You might notice some irritation and congestion in your nose or some drainage.  This is from the oxygen used during your procedure.  There is no need for concern and it should clear up in a day or so.  SYMPTOMS TO REPORT IMMEDIATELY:   Following lower endoscopy (colonoscopy or flexible sigmoidoscopy):  Excessive amounts of blood in the stool  Significant tenderness or worsening of abdominal pains  Swelling of the abdomen that is new, acute  Fever of 100F or higher   For urgent or emergent issues, a gastroenterologist can be reached at any hour by calling [phone].   DIET:  We do recommend a small meal at first, but then you may proceed to your regular diet.  Drink plenty of fluids but you should avoid alcoholic beverages for 24 hours.  ACTIVITY:  You should plan to take it easy for the rest of today  and you should NOT DRIVE or use heavy machinery until tomorrow (because of the sedation medicines used during the test).    FOLLOW UP: Our staff will call the number listed on your records the next business day following your procedure to check on you and address any questions or concerns that you may have regarding the information given to you following your procedure. If we do not reach you, we will leave a message.  However, if you are feeling well and you are not experiencing any problems, there is no need to return our call.  We will assume that you have returned to your regular daily activities without incident.  If any biopsies were taken you will be contacted by phone or by letter within the next 1-3 weeks.  Please call us at [phone] if you have not heard about the biopsies in 3 weeks.    SIGNATURES/CONFIDENTIALITY: You and/or your care partner have signed paperwork which will be entered into your electronic medical record.  These signatures attest to the fact that that the information above on your After Visit Summary has been reviewed and is understood.  Full responsibility of the confidentiality of this discharge information lies with you and/or your care-partner.

## 2018-06-07 NOTE — Progress Notes (Signed)
To PACU, VSS. Report to [email or web address] 

## 2018-06-08 ENCOUNTER — Telehealth: Payer: Self-pay

## 2018-06-08 NOTE — Telephone Encounter (Signed)
  Follow up Call-  Call back number 06/07/2018 10/28/2016  Post procedure Call Back phone  # [phone]  Permission to leave phone message Yes Yes  Some recent data might be hidden     Patient questions:  Do you have a fever, pain , or abdominal swelling? No. Pain Score  0 *  Have you tolerated food without any problems? Yes.    Have you been able to return to your normal activities? Yes.    Do you have any questions about your discharge instructions: Diet   No. Medications  No. Follow up visit  No.  Do you have questions or concerns about your Care? No.  Actions: * If pain score is 4 or above: No action needed, pain <4.

## 2018-06-24 ENCOUNTER — Encounter: Payer: Self-pay | Admitting: Gastroenterology

## 2018-08-22 ENCOUNTER — Ambulatory Visit
Admission: RE | Admit: 2018-08-22 | Discharge: 2018-08-22 | Disposition: A | Discharge status: Home / Self Care | Payer: Medicare Other | Source: Ambulatory Visit | Attending: Internal Medicine | Admitting: Internal Medicine

## 2018-08-22 ENCOUNTER — Other Ambulatory Visit: Payer: Self-pay | Admitting: Internal Medicine

## 2018-08-22 DIAGNOSIS — M5416 Radiculopathy, lumbar region: Secondary | ICD-10-CM

## 2018-09-04 ENCOUNTER — Encounter (HOSPITAL_COMMUNITY): Payer: Self-pay

## 2018-09-04 ENCOUNTER — Emergency Department (HOSPITAL_COMMUNITY)
Admission: EM | Admit: 2018-09-04 | Discharge: 2018-09-04 | Disposition: A | Discharge status: Home / Self Care | Payer: Medicare Other | Attending: Emergency Medicine | Admitting: Emergency Medicine

## 2018-09-04 ENCOUNTER — Emergency Department (HOSPITAL_COMMUNITY): Payer: Medicare Other

## 2018-09-04 DIAGNOSIS — M5416 Radiculopathy, lumbar region: Secondary | ICD-10-CM | POA: Insufficient documentation

## 2018-09-04 DIAGNOSIS — I2510 Atherosclerotic heart disease of native coronary artery without angina pectoris: Secondary | ICD-10-CM | POA: Diagnosis not present

## 2018-09-04 DIAGNOSIS — Z7982 Long term (current) use of aspirin: Secondary | ICD-10-CM | POA: Insufficient documentation

## 2018-09-04 DIAGNOSIS — I10 Essential (primary) hypertension: Secondary | ICD-10-CM | POA: Diagnosis not present

## 2018-09-04 DIAGNOSIS — Z8673 Personal history of transient ischemic attack (TIA), and cerebral infarction without residual deficits: Secondary | ICD-10-CM | POA: Insufficient documentation

## 2018-09-04 DIAGNOSIS — Z79899 Other long term (current) drug therapy: Secondary | ICD-10-CM | POA: Insufficient documentation

## 2018-09-04 DIAGNOSIS — R200 Anesthesia of skin: Secondary | ICD-10-CM | POA: Diagnosis present

## 2018-09-04 DIAGNOSIS — R202 Paresthesia of skin: Secondary | ICD-10-CM

## 2018-09-04 LAB — COMPREHENSIVE METABOLIC PANEL
ALT: 22 U/L (ref 0–44)
AST: 21 U/L (ref 15–41)
Albumin: 3.6 g/dL (ref 3.5–5.0)
Alkaline Phosphatase: 53 U/L (ref 38–126)
Anion gap: 12 (ref 5–15)
BILIRUBIN TOTAL: 0.6 mg/dL (ref 0.3–1.2)
BUN: 25 mg/dL — ABNORMAL HIGH (ref 8–23)
CO2: 24 mmol/L (ref 22–32)
CREATININE: 0.96 mg/dL (ref 0.61–1.24)
Calcium: 9.5 mg/dL (ref 8.9–10.3)
Chloride: 103 mmol/L (ref 98–111)
GFR calc Af Amer: >60 mL/min (ref >60)
Glucose, Bld: 114 mg/dL — ABNORMAL HIGH (ref 70–99)
Potassium: 4 mmol/L (ref 3.5–5.1)
Sodium: 139 mmol/L (ref 135–145)
Total Protein: 6.6 g/dL (ref 6.5–8.1)

## 2018-09-04 LAB — CBC WITH DIFFERENTIAL/PLATELET
Abs Immature Granulocytes: 0.1 10*3/uL — ABNORMAL HIGH (ref 0.00–0.07)
Basophils Absolute: 0 10*3/uL (ref 0.0–0.1)
Basophils Relative: 0 %
Eosinophils Absolute: 0 10*3/uL (ref 0.0–0.5)
Eosinophils Relative: 0 %
HCT: 44.7 % (ref 39.0–52.0)
Hemoglobin: 14.8 g/dL (ref 13.0–17.0)
Immature Granulocytes: 1 %
Lymphocytes Relative: 18 %
Lymphs Abs: 1.9 10*3/uL (ref 0.7–4.0)
MCH: 28.6 pg (ref 26.0–34.0)
MCHC: 33.1 g/dL (ref 30.0–36.0)
MCV: 86.5 fL (ref 80.0–100.0)
Monocytes Absolute: 1.1 10*3/uL — ABNORMAL HIGH (ref 0.1–1.0)
Monocytes Relative: 10 %
Neutro Abs: 7.9 10*3/uL — ABNORMAL HIGH (ref 1.7–7.7)
Neutrophils Relative %: 71 %
Platelets: 244 10*3/uL (ref 150–400)
RBC: 5.17 MIL/uL (ref 4.22–5.81)
RDW: 12.3 % (ref 11.5–15.5)
WBC: 11.1 10*3/uL — AB (ref 4.0–10.5)
nRBC: 0 % (ref 0.0–0.2)

## 2018-09-04 MED ORDER — OXYCODONE-ACETAMINOPHEN 5-325 MG PO TABS: 1.0000 | ORAL_TABLET | ORAL | 0 refills | Status: DC | PRN

## 2018-09-04 MED ORDER — HYDROMORPHONE HCL 1 MG/ML IJ SOLN
1.0000 mg | Freq: Once | INTRAMUSCULAR | Status: AC
  Administered 2018-09-04: 1 mg via INTRAVENOUS
  Filled 2018-09-04: qty 1

## 2018-09-04 MED ORDER — MORPHINE SULFATE (PF) 4 MG/ML IV SOLN
4.0000 mg | Freq: Once | INTRAVENOUS | Status: AC
  Administered 2018-09-04: 4 mg via INTRAVENOUS
  Filled 2018-09-04: qty 1

## 2018-09-04 MED ORDER — ONDANSETRON HCL 4 MG/2ML IJ SOLN
4.0000 mg | Freq: Once | INTRAMUSCULAR | Status: AC
  Administered 2018-09-04: 4 mg via INTRAVENOUS
  Filled 2018-09-04: qty 2

## 2018-09-04 NOTE — ED Provider Notes (Signed)
[facility] EMERGENCY DEPARTMENT Provider Note   CSN: [id number] Arrival date & time: 09/04/18  0221     History   Chief Complaint Chief Complaint  Patient presents with  . Leg Pain    L Hip; L Foot; Numbness    HPI Jose Dyer is a 68 y.o. male.  The history is provided by the patient.  Leg Pain  He has history of hypertension, hyperlipidemia, stroke, coronary artery disease, herniated lumbar disc and is scheduled for microdiscectomy in 2 days.  At 1 AM, he noted numbness in his left foot which spread to his left lower leg and also into his right foot.  He also noticed numbness in the left side of his face.  When he stood up, he noticed that he got dizzy and developed nausea.  Symptoms are similar to what he had with a stroke previously.  He states that now, the facial numbness has gone away.  He is currently in a washout period for Brilinta.  He states that his neurosurgeon who told him he should not have any narcotics before surgery.  He is complaining of severe low back pain radiating down his left leg.  Past Medical History:  Diagnosis Date  . Anginal pain ([place])   . Corneal scarring   . Coronary artery disease   . Dyslipidemia   . FHx: cardiovascular disease   . GERD (gastroesophageal reflux disease)   . History of hiatal hernia   . History of kidney stones    "none since the 1990s" (01/27/2017)  . Hypertension   . Stroke [facility]) 2014   denies residual on 01/27/2017    Patient Active Problem List   Diagnosis Date Noted  . Coronary artery disease: Cath a. (May 2018) succ PTCA of distal circ, unsucc at CTO prox LAD. b. (June 2018) successful CTO of prox to mid LAD  01/28/2017  . Post PTCA 12/15/2016  . Angina pectoris ([place]) 12/14/2016  . Corneal scarring 12/16/2015  . Drug-induced erectile dysfunction 12/16/2015  . Statin intolerance 06/28/2014  . CVA (cerebral infarction) 01/30/2014  . Hyperlipidemia LDL goal <100 01/30/2014  . Hypertension  12/15/2012    Past Surgical History:  Procedure Laterality Date  . COLONOSCOPY  2004  . CORONARY ANGIOPLASTY WITH STENT PLACEMENT  01/27/2017  . CORONARY CTO INTERVENTION N/A 01/27/2017   Procedure: Coronary CTO Intervention;  Surgeon: Jettie Booze, MD;  Location: [place] CV LAB;  Service: Cardiovascular;  Laterality: N/A;  . CORONARY STENT INTERVENTION  12/15/2016    Successful PTCA and stenting of the distal circumflex with 3.0 x 20 mm Synergy, stenosis reduced from 90% to 0% with maintenance of TIMI-3 to TIMI-3 flow.  . CORONARY STENT INTERVENTION N/A 12/15/2016   Procedure: Coronary Stent Intervention;  Surgeon: Adrian Prows, MD;  Location: [place] CV LAB;  Service: Cardiovascular;  Laterality: N/A;  LAD  . CORONARY STENT INTERVENTION N/A 01/27/2017   Procedure: Coronary Stent Intervention;  Surgeon: Jettie Booze, MD;  Location: [place] CV LAB;  Service: Cardiovascular;  Laterality: N/A;  . LEFT HEART CATH AND CORONARY ANGIOGRAPHY N/A 12/15/2016   Procedure: Left Heart Cath and Coronary Angiography;  Surgeon: Adrian Prows, MD;  Location: [place] CV LAB;  Service: Cardiovascular;  Laterality: N/A;  . LEFT HEART CATH AND CORONARY ANGIOGRAPHY N/A 01/27/2017   Procedure: Left Heart Cath and Coronary Angiography;  Surgeon: Jettie Booze, MD;  Location: [place] CV LAB;  Service: Cardiovascular;  Laterality: N/A;  Home Medications    Prior to Admission medications   Medication Sig Start Date End Date Taking? Authorizing Provider  amLODipine (NORVASC) 5 MG tablet Take 5 mg by mouth at bedtime. 11/04/16   [provider]  aspirin 81 MG tablet Take 81 mg by mouth daily.    [provider]  BRILINTA 60 MG TABS tablet Take 60 mg by mouth 2 (two) times daily. 05/19/18   [provider]  nitroGLYCERIN (NITROSTAT) 0.4 MG SL tablet Place 1 tablet (0.4 mg total) under the tongue every 5 (five) minutes as needed for chest pain. 01/28/17    Delos Haring, PA-C    Family History Family History  Problem Relation Age of Onset  . Colon cancer Neg Hx   . Esophageal cancer Neg Hx   . Rectal cancer Neg Hx   . Stomach cancer Neg Hx     Social History Social History   Tobacco Use  . Smoking status: Never Smoker  . Smokeless tobacco: Never Used  Substance Use Topics  . Alcohol use: No  . Drug use: No     Allergies   Statins   Review of Systems Review of Systems  All other systems reviewed and are negative.    Physical Exam Updated Vital Signs BP (!) 145/65 (BP Location: Left Arm)   Pulse 61   Temp 97.7 F (36.5 C) (Oral)   Resp 18   SpO2 100%   Physical Exam Vitals signs and nursing note reviewed.    67 year old male, appears uncomfortable, but is in no acute distress. Vital signs are significant for mild elevation of systolic blood pressure. Oxygen saturation is 100%, which is normal. Head is normocephalic and atraumatic. PERRLA, EOMI. Oropharynx is clear. Neck is nontender and supple without adenopathy or JVD. Back is nontender and there is no CVA tenderness. Lungs are clear without rales, wheezes, or rhonchi. Chest is nontender. Heart has regular rate and rhythm without murmur. Abdomen is soft, flat, nontender without masses or hepatosplenomegaly and peristalsis is normoactive. Extremities have no cyanosis or edema, full range of motion is present. Skin is warm and dry without rash. Neurologic: Awake and alert, oriented x3, speech normal, normal sensation in all 3 divisions of 5th cranial nerve bilaterally, full extraocular movements bilaterally, no facial asymmetry, tongue protrudes in the midline.  Strength 5/5 in both arms and right leg, weakness of extensor hallucis longus on the left foot 2/5.  No objective decrease in station in the arms or right leg.  Decreased sensation over the lateral aspect of the left foot.  ED Treatments / Results  Labs (all labs ordered are listed, but only abnormal  results are displayed) Labs Reviewed  COMPREHENSIVE METABOLIC PANEL - Abnormal; Notable for the following components:      Result Value   Glucose, Bld 114 (*)    BUN 25 (*)    All other components within normal limits  CBC WITH DIFFERENTIAL/PLATELET - Abnormal; Notable for the following components:   WBC 11.1 (*)    Neutro Abs 7.9 (*)    Monocytes Absolute 1.1 (*)    Abs Immature Granulocytes 0.10 (*)    All other components within normal limits   Radiology Mr Brain Wo Contrast  Result Date: 09/04/2018 CLINICAL DATA:  Initial evaluation for acute left lower extremity pain and numbness. EXAM: MRI HEAD WITHOUT CONTRAST TECHNIQUE: Multiplanar, multiecho pulse sequences of the brain and surrounding structures were obtained without intravenous contrast. COMPARISON:  None available. FINDINGS: Brain: Generalized  age-related cerebral atrophy. No significant cerebral white matter changes for age. Mildly prominent dilated perivascular spaces noted at the left basal ganglia. No abnormal foci of restricted diffusion to suggest acute or subacute ischemia. Gray-white matter differentiation maintained. No encephalomalacia to suggest chronic infarction. No acute intracranial hemorrhage. Few small chronic micro hemorrhages noted at the left caudate and right midbrain. No mass lesion, midline shift or mass effect. No hydrocephalus. No extra-axial fluid collection. Vascular: Major intracranial vascular flow voids maintained. Skull and upper cervical spine: Craniocervical junction within normal limits. Multilevel degenerative spondylolysis within the upper cervical spine with resultant mild to moderate diffuse spinal stenosis. Bone marrow signal intensity within normal limits. No scalp soft tissue abnormality. Sinuses/Orbits: Globes and orbital soft tissues within normal limits. Scattered mucosal thickening throughout the paranasal sinuses. Superimposed air-fluid level noted within the left maxillary sinus. No mastoid  effusion. Inner ear structures grossly normal. Other: None. IMPRESSION: 1. No acute intracranial abnormality. 2. Degenerative spondylolysis within the upper cervical spine with associated mild to moderate diffuse spinal stenosis, incompletely evaluated on this exam. Finding could be further assessed with dedicated MRI of the cervical spine as clinically desired. 3. Acute left maxillary sinusitis. Electronically Signed   By: Jeannine Boga M.D.   On: 09/04/2018 05:26    Procedures Procedures  Medications Ordered in ED Medications - No data to display   Initial Impression / Assessment and Plan / ED Course  I have reviewed the triage vital signs and the nursing notes.  Pertinent labs & imaging results that were available during my care of the patient were reviewed by me and considered in my medical decision making (see chart for details).  Left L5 radiculopathy secondary to herniated disc.  No evidence of stroke.  Suspect numbness outside of the area of the radiculopathy is mainly related to anxiety.  No indication for activation of code stroke, will send for MRI to rule out stroke.  Old records are reviewed showing coronary stent placement in May 2018.  Labs are unremarkable.  MRI of the brain shows no evidence of stroke, suggestion of spinal stenosis in the cervical spine.  I have informed the patient of these findings.  He states that he did not get any pain relief with morphine.  He is also been given a dose of hydromorphone which he states did not give him any pain relief.  I did discuss the case with Dr. Arnoldo Morale, on-call for Dr. Kathyrn Sheriff, who states that he did not see any contraindication to the patient having narcotic pain medication for the next 2 days until surgery.  He is discharged with prescription for oxycodone-acetaminophen.  Final Clinical Impressions(s) / ED Diagnoses   Final diagnoses:  Lumbar radiculopathy  Paresthesias    ED Discharge Orders         Ordered     oxyCODONE-acetaminophen (PERCOCET) 5-325 MG tablet  Every 4 hours PRN     09/04/18 5638           Delora Fuel, MD 75/64/33 [phone]
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
## 2018-09-04 NOTE — ED Notes (Signed)
Pt. c/o his feet being cold. This tech offered the Pt. socks. Pt. accepted offer and this tech attempted to place socks on Pt.'s feet. Pt. immediately began yelling and writhing around on the bed saying his toenail had been caught on the sock. This tech apologized and carefully attempted to place the socks on again being vigilant to not catch Pt.s toenails. The Pt. again proceeded to scream and writhe around saying his toenail hurt. This tech placed the socks on the bed and told the Pt. since my attempts to place the socks on his feet were too painful the only other option was for him to put them on himself. This tech then went and got the Pt. a warm blanket and covered Pt. up upon arrival back to the room. Socks were still sitting at end of the bed where they were originally placed.

## 2018-09-04 NOTE — Discharge Instructions (Signed)
Go for your surgery on January 21, as scheduled.

## 2018-09-04 NOTE — ED Notes (Signed)
Pt discharged from ED; instructions provided and scripts given; Pt encouraged to return to ED if symptoms worsen and to f/u with PCP; Pt verbalized understanding of all instructions 

## 2018-09-04 NOTE — ED Triage Notes (Signed)
Pt arrived via [facility]; pt from [facility] with c/o L hip, leg, and foot pain with numbness. Pt also c/o numbness to L side of face. Pt has hx of CVA, HTN with previous deficits on R side; pt states that he has blurred vision in L eye; 190/96, 70, 24R

## 2018-09-04 NOTE — ED Notes (Signed)
Pt to MRI

## 2018-09-30 ENCOUNTER — Other Ambulatory Visit: Payer: Self-pay

## 2018-09-30 MED ORDER — PANTOPRAZOLE SODIUM 40 MG PO TBEC: 40.0000 mg | DELAYED_RELEASE_TABLET | Freq: Every day | ORAL | 2 refills | Status: DC

## 2018-10-18 ENCOUNTER — Other Ambulatory Visit: Payer: Self-pay

## 2018-10-18 MED ORDER — REPATHA 140 MG/ML ~~LOC~~ SOSY: 1.0000 mL | PREFILLED_SYRINGE | SUBCUTANEOUS | 1 refills | Status: DC

## 2018-10-25 ENCOUNTER — Other Ambulatory Visit: Payer: Self-pay

## 2018-10-25 MED ORDER — BRILINTA 60 MG PO TABS: 60.0000 mg | ORAL_TABLET | Freq: Two times a day (BID) | ORAL | 6 refills | Status: DC

## 2018-12-27 ENCOUNTER — Other Ambulatory Visit: Payer: Self-pay | Admitting: Cardiology

## 2018-12-27 NOTE — Telephone Encounter (Signed)
Please fill

## 2019-01-10 ENCOUNTER — Other Ambulatory Visit: Payer: Self-pay | Admitting: Cardiology

## 2019-01-11 NOTE — Telephone Encounter (Signed)
Please fill

## 2019-05-30 ENCOUNTER — Ambulatory Visit: Payer: Medicare Other | Admitting: Cardiology

## 2019-05-30 ENCOUNTER — Other Ambulatory Visit: Payer: Self-pay

## 2019-05-30 ENCOUNTER — Other Ambulatory Visit: Payer: Self-pay | Admitting: Cardiology

## 2019-05-30 ENCOUNTER — Encounter: Payer: Self-pay | Admitting: Cardiology

## 2019-05-30 VITALS — BP 151/79 | HR 60 | Temp 98.0°F | Ht 68.0 in | Wt 182.0 lb

## 2019-05-30 DIAGNOSIS — Z8673 Personal history of transient ischemic attack (TIA), and cerebral infarction without residual deficits: Secondary | ICD-10-CM

## 2019-05-30 DIAGNOSIS — I25118 Atherosclerotic heart disease of native coronary artery with other forms of angina pectoris: Secondary | ICD-10-CM | POA: Diagnosis not present

## 2019-05-30 DIAGNOSIS — Z789 Other specified health status: Secondary | ICD-10-CM | POA: Diagnosis not present

## 2019-05-30 DIAGNOSIS — E785 Hyperlipidemia, unspecified: Secondary | ICD-10-CM

## 2019-05-30 MED ORDER — ROSUVASTATIN CALCIUM 5 MG PO TABS: 5.0000 mg | ORAL_TABLET | Freq: Every day | ORAL | 2 refills | Status: DC

## 2019-05-30 NOTE — Progress Notes (Signed)
° °Primary Physician:  Holwerda, Scott, MD ° ° °Patient ID: Jose Dyer, male    DOB: 03/20/1951, 68 y.o.   MRN: [id number] ° °Subjective:  ° ° °Chief Complaint  °Patient presents with  °• Coronary Artery Disease  °• Hypertension  °• Hyperlipidemia  °• Follow-up  ° ° °HPI: Jose Dyer  is a 68 y.o. male  with history of stroke without residual defectin 2015, CAD with CTO LAD S/P revascularization of the LAD on 01/27/2017 by retrograde access. He had undergone successful revascularization and stenting of the circumflex coronary artery on 12/14/2016 which is widely patent. ° °Patient is here to discuss his options for hyperlipidemia. He was previously on Repatha and had significant reduction in LDL with this. He states that he had severe activity limiting back pain. He quit using repatha for a few weeks, back pain resolved. He then started back, and back pain returned. He has been discussing with his golfing friends that are physicians and he was advised to try low dose statin 1 day a week.  ° °He continues to play golf daily and exercise regularly. No complaints today. No chest pain or shortness of breath. ° °Past Medical History:  °Diagnosis Date  °• Anginal pain (H[facility])   °• Corneal scarring   °• Coronary artery disease   °• Dyslipidemia   °• FHx: cardiovascular disease   °• GERD (gastroesophageal reflux disease)   °• History of hiatal hernia   °• History of kidney stones   ° "none since the 1990s" (01/27/2017)  °• Hypertension   °• Stroke ([facility]) 2014  ° denies residual on 01/27/2017  ° ° °Past Surgical History:  °Procedure Laterality Date  °• COLONOSCOPY  2004  °• CORONARY ANGIOPLASTY WITH STENT PLACEMENT  01/27/2017  °• CORONARY CTO INTERVENTION N/A 01/27/2017  ° Procedure: Coronary CTO Intervention;  Surgeon: Varanasi, Jayadeep S, MD;  Location: [facility];  Service: Cardiovascular;  Laterality: N/A;  °• CORONARY STENT INTERVENTION  12/15/2016  °  Successful PTCA and stenting of the distal circumflex with 3.0 x  20 mm Synergy, stenosis reduced from 90% to 0% with maintenance of TIMI-3 to TIMI-3 flow.  °• CORONARY STENT INTERVENTION N/A 12/15/2016  ° Procedure: Coronary Stent Intervention;  Surgeon: Jay Ganji, MD;  Location: [facility];  Service: Cardiovascular;  Laterality: N/A;  LAD  °• CORONARY STENT INTERVENTION N/A 01/27/2017  ° Procedure: Coronary Stent Intervention;  Surgeon: Varanasi, Jayadeep S, MD;  Location: [facility];  Service: Cardiovascular;  Laterality: N/A;  °• LEFT HEART CATH AND CORONARY ANGIOGRAPHY N/A 12/15/2016  ° Procedure: Left Heart Cath and Coronary Angiography;  Surgeon: Jay Ganji, MD;  Location: [facility];  Service: Cardiovascular;  Laterality: N/A;  °• LEFT HEART CATH AND CORONARY ANGIOGRAPHY N/A 01/27/2017  ° Procedure: Left Heart Cath and Coronary Angiography;  Surgeon: Varanasi, Jayadeep S, MD;  Location: [facility];  Service: Cardiovascular;  Laterality: N/A;  ° ° °Social History  ° °Socioeconomic History  °• Marital status: Married  °  Spouse name: Not on file  °• Number of children: Not on file  °• Years of education: Not on file  °• Highest education level: Not on file  °Occupational History  °• Not on file  °Social Needs  °• Financial resource strain: Not on file  °• Food insecurity  °  Worry: Not on file  °  Inability: Not on file  °• Transportation needs  °  Medical: Not on file  °    file    Non-medical: Not on file  Tobacco Use   Smoking status: Never Smoker   Smokeless tobacco: Never Used  Substance and Sexual Activity   Alcohol use: No   Drug use: No   Sexual activity: Yes  Lifestyle   Physical activity    Days per week: Not on file    Minutes per session: Not on file   Stress: Not on file  Relationships   Social connections    Talks on phone: Not on file    Gets together: Not on file    Attends religious service: Not on file    Active member of club or organization: Not on file    Attends meetings of clubs or organizations: Not on file     Relationship status: Not on file   Intimate partner violence    Fear of current or ex partner: Not on file    Emotionally abused: Not on file    Physically abused: Not on file    Forced sexual activity: Not on file  Other Topics Concern   Not on file  Social History Narrative   Not on file    Review of Systems  Constitution: Negative for decreased appetite, malaise/fatigue, weight gain and weight loss.  Eyes: Negative for visual disturbance.  Cardiovascular: Negative for chest pain, claudication, dyspnea on exertion, leg swelling, orthopnea, palpitations and syncope.  Respiratory: Negative for hemoptysis and wheezing.   Endocrine: Negative for cold intolerance and heat intolerance.  Hematologic/Lymphatic: Does not bruise/bleed easily.  Skin: Negative for nail changes.  Musculoskeletal: Negative for back pain, muscle weakness and myalgias.  Gastrointestinal: Negative for abdominal pain, change in bowel habit, nausea and vomiting.  Neurological: Negative for difficulty with concentration, dizziness, focal weakness and headaches.  Psychiatric/Behavioral: Negative for altered mental status and suicidal ideas.  All other systems reviewed and are negative.     Objective:  Blood pressure (!) 151/79, pulse 60, temperature 98 F (36.7 C), height 5' 8" (1.727 m), weight 182 lb (82.6 kg), SpO2 98 %. Body mass index is 27.67 kg/m.    Physical Exam  Constitutional: He is oriented to person, place, and time. Vital signs are normal. He appears well-developed and well-nourished.  HENT:  Head: Normocephalic and atraumatic.  Neck: Normal range of motion.  Cardiovascular: Normal rate, regular rhythm and intact distal pulses.  Murmur heard.  Midsystolic murmur is present with a grade of 2/6 at the lower left sternal border and apex.  Early diastolic murmur is present with a grade of 1/6 at the upper left sternal border. Pulses:      Femoral pulses are 2+ on the right side with bruit and 2+  on the left side. Pigmented  Pulmonary/Chest: Effort normal and breath sounds normal. No accessory muscle usage. No respiratory distress.  Abdominal: Soft. Bowel sounds are normal.  Musculoskeletal: Normal range of motion.  Neurological: He is alert and oriented to person, place, and time.  Skin: Skin is warm and dry.  Vitals reviewed.  Radiology: No results found.  Laboratory examination:   Labs   Labs 02/16/2018: Serum glucose 92 mg, BUN 25, creatinine 1.0, eGFR >60 mL, CMP normal. HB 13.7/HCT 42.5, platelets 204. Total cholesterol 165, triglycerides 65, HDL 38, LDL 114. TSH normal. Labs 02/16/2018: Total cholesterol 165, triglycerides 65, HDL 38, LDL 114. CMP Latest Ref Rng & Units 09/04/2018 01/28/2017 01/27/2017  Glucose 70 - 99 mg/dL 114(H) 111(H) 101(H)  BUN 8 - 23 mg/dL 25(H) 17 23(H)  Creatinine 0.61 -  1.03 0.99  °Sodium 135 - 145 mmol/L 139 138 139  °Potassium 3.5 - 5.1 mmol/L 4.0 4.0 3.6  °Chloride 98 - 111 mmol/L 103 106 106  °CO2 22 - 32 mmol/L 24 26 26  °Calcium 8.9 - 10.3 mg/dL 9.5 8.6(L) 8.7(L)  °Total Protein 6.5 - 8.1 g/dL 6.6 - -  °Total Bilirubin 0.3 - 1.2 mg/dL 0.6 - -  °Alkaline Phos 38 - 126 U/L 53 - -  °AST 15 - 41 U/L 21 - -  °ALT 0 - 44 U/L 22 - -  ° °CBC Latest Ref Rng & Units 09/04/2018 01/28/2017 01/27/2017  °WBC 4.0 - 10.5 K/uL 11.1(H) 7.5 6.0  °Hemoglobin 13.0 - 17.0 g/dL 14.8 10.1(L) 12.4(L)  °Hematocrit 39.0 - 52.0 % 44.7 30.8(L) 38.2(L)  °Platelets 150 - 400 K/uL 244 189 176  ° °Lipid Panel  °   °Component Value Date/Time  ° CHOL 154 09/22/2016 1022  ° TRIG 190 (H) 09/22/2016 1022  ° HDL 36 (L) 09/22/2016 1022  ° CHOLHDL 4.3 09/22/2016 1022  ° VLDL 38 (H) 09/22/2016 1022  ° LDLCALC 80 09/22/2016 1022  ° °HEMOGLOBIN A1C °Lab Results  °Component Value Date  ° HGBA1C 5.5 01/30/2014  ° MPG 111 01/30/2014  ° °TSH °No results for input(s): TSH in the last 8760 hours. ° °PRN Meds:. °Medications Discontinued During This Encounter  °Medication Reason  °•  BRILINTA 60 MG TABS tablet Error  °• oxyCODONE-acetaminophen (PERCOCET) 5-325 MG tablet Error  °• REPATHA 140 MG/ML SOSY Error  ° °Current Meds  °Medication Sig  °• amLODipine (NORVASC) 5 MG tablet Take 5 mg by mouth at bedtime.  °• aspirin 81 MG tablet Take 81 mg by mouth daily.  °• nitroGLYCERIN (NITROSTAT) 0.4 MG SL tablet Place 1 tablet (0.4 mg total) under the tongue every 5 (five) minutes as needed for chest pain.  °• pantoprazole (PROTONIX) 40 MG tablet TAKE 1 TABLET BY MOUTH  DAILY  ° ° °Cardiac Studies:  ° °Coronary angiogram by Dr. Varanasi 01/27/2017: CTO Prox LAD to Mid LAD lesion, 100 %stenosed, right to left collaterals.Successful CTO PCI with a SYNERGY DES 3.5X38 drug eluting stent which was successfully placed, postdilated to > 4 mm. Distal Cx 3.0x20 mm Synergy DES placed 12/14/2016 widely patent. Normal LVEF. ° °Echocardiogram 09/23/2016: °Left ventricle cavity is normal in size. Moderate concentric hypertrophy of the left ventricle. Normal global wall motion. Doppler evidence of grade II (pseudonormal) diastolic dysfunction. Diastolic dysfunction findings suggests elevated LA/LV end diastolic pressure. Calculated EF 66%. °Left atrial cavity is severely dilated at 5.1 cm. Aneurysmal interatrial septum without PFO. °Right atrial cavity is moderately dilated. °Trileaflet aortic valve with mild to moderate regurgitation. °Miotral valve apperas normal. There is mild to moderate regurgitation. °Mild to moderate tricuspid regurgitation. Mild to moderate pulmonary hypertension. Pulmonary artery systolic pressure is estimated at 40 mm Hg. °IVC is dilated with respiratory variation. Suggests mild elevation in right hear ° °Carotid duplex 01/24/2014: Minimal amount of bilateral intimal wall thickening right greater than the left. ° °Assessment:  ° °Atherosclerosis of native coronary artery of native heart with stable angina pectoris (HCC) - Plan: EKG 12-Lead ° °Mild hyperlipidemia ° °History of CVA  (cerebrovascular accident) ° °Statin intolerance ° °EKG 05/30/2019: Normal sinus rhythm at rate of 60 bpm with 3 beat run of atrial tachycardia, left atrial enlargement, poor R-wave progression, probably normal variant. No evidence of ischemia. No significant change from 09/13/2018, except for atrial tachycardia ° °Recommendations:  ° °Mr. Runk Cristofher has history   [place] has history of stroke without residual defectin 2015, CAD with CTO LAD S/P revascularization of the LAD on 01/27/2017 by retrograde access. He had undergone successful revascularization and stenting of the circumflex coronary artery on 12/14/2016 which is widely patent.   Since last seen by [place], he has stopped Repatha due to significant back pain.  After discussion with the patient, he is willing to try Crestor 5 mg 1 day/week and if he tolerates, try to increase this until he does not tolerate.  I have also discussed trying Praluent for Nexletol.  He wishes to try Crestor first as he is also discussed with his friends, who are also physicians, and this was recommended.  I am agreeable to this as statin is certainly indicated in view of his CAD and CVA history.  I would recommend repeating his lipids in 3 months and if not well controlled or if he does not tolerate even daily dose of Crestor, can potentially also add on Praluent or Nexletol.  His blood pressure is slightly elevated today, but is normally very well controlled and he closely monitors at home.  We will continue with home monitoring.  No changes were made to his medications. Noted to have 3 beats of atrial tachycardia on EKG, that was asymptomatic. He is exercising regularly without any symptoms of angina.  We will plan to see him back in 3 months for follow-up on his lipids.  Miquel Dunn, MSN, APRN, FNP-C [facility]. [place] Office: [phone] Fax: [phone]

## 2019-06-01 ENCOUNTER — Encounter: Payer: Self-pay | Admitting: Cardiology

## 2019-08-19 ENCOUNTER — Other Ambulatory Visit: Payer: Self-pay | Admitting: Cardiology

## 2019-08-30 ENCOUNTER — Ambulatory Visit: Payer: Medicare Other | Admitting: Cardiology

## 2019-09-01 ENCOUNTER — Other Ambulatory Visit: Payer: Self-pay

## 2019-09-01 MED ORDER — ROSUVASTATIN CALCIUM 5 MG PO TABS: 5.0000 mg | ORAL_TABLET | Freq: Every day | ORAL | 2 refills | Status: DC

## 2019-09-02 ENCOUNTER — Other Ambulatory Visit: Payer: Self-pay | Admitting: Cardiology

## 2019-09-04 IMAGING — MR MR HEAD W/O CM
12 of 13 series · 43 of 48 positions shown · non-contrast
Comparison: None available.

CLINICAL DATA: Initial evaluation for acute left lower extremity
pain and numbness.

EXAM:
MRI HEAD WITHOUT CONTRAST
TECHNIQUE: Multiplanar, multiecho pulse sequences of the brain and surrounding
structures were obtained without intravenous contrast.

[Series 5: DWI · axial · 3.0mm · 0.88mm/px · z∈[-7,+131]mm · 6 of 96 slices shown (1 of 4)]
[im 1/96]
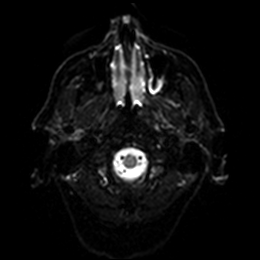
[im 20/96]
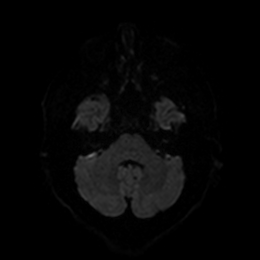
[im 39/96]
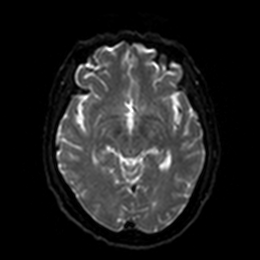
[im 58/96]
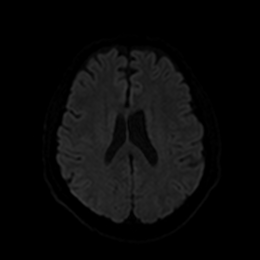
[im 77/96]
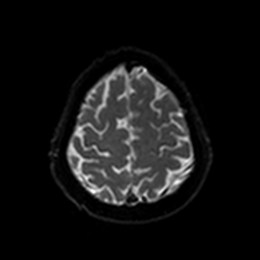
[im 96/96]
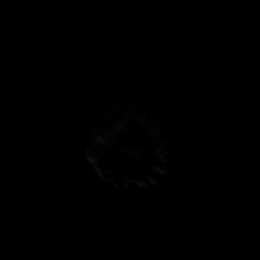

[Series 6: DWI · axial · 3.0mm · 0.88mm/px · z∈[-7,+131]mm · 4 of 48 slices shown (2 of 4)]
[im 1/48]
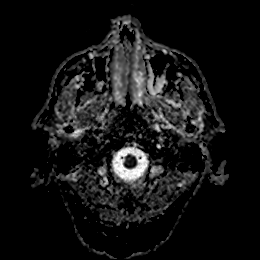
[im 16/48]
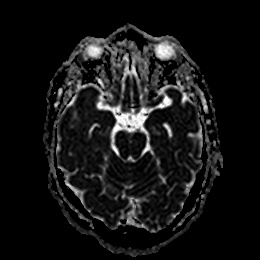
[im 32/48]
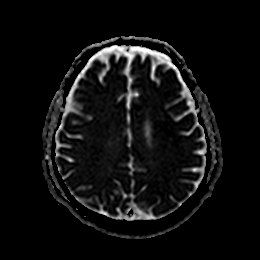
[im 48/48]
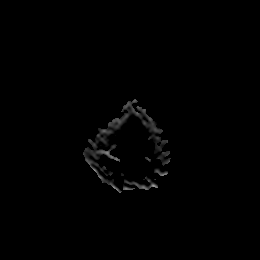

[Series 7: DWI · coronal · 4.0mm · 0.88mm/px · 5 of 68 slices shown (3 of 4)]
[im 1/68]
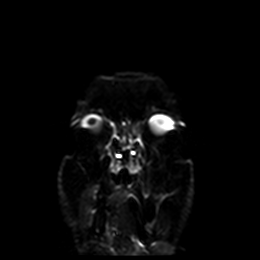
[im 17/68]
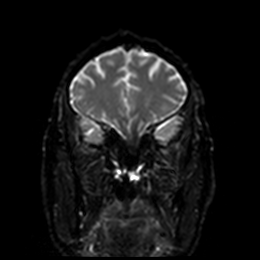
[im 34/68]
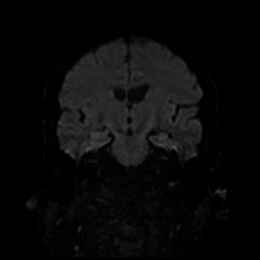
[im 51/68]
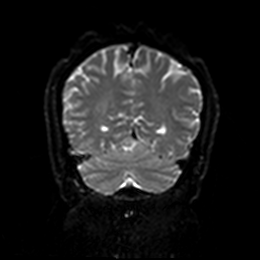
[im 68/68]
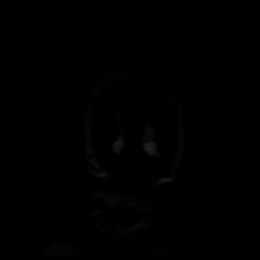

[Series 8: DWI · coronal · 4.0mm · 0.88mm/px · 3 of 34 slices shown (4 of 4)]
[im 1/34]
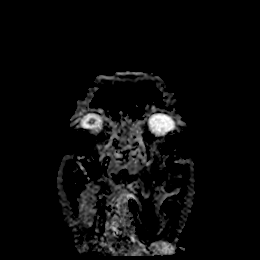
[im 17/34]
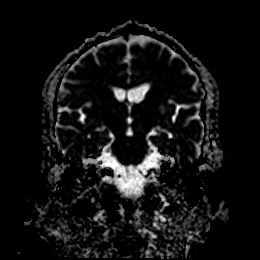
[im 34/34]
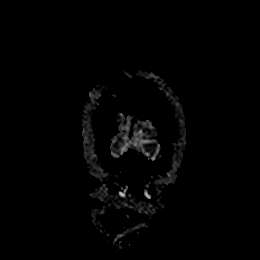

[Series 9: T1 · sagittal · 5.0mm · 0.75mm/px · 2 of 25 slices shown]
[im 1/25]
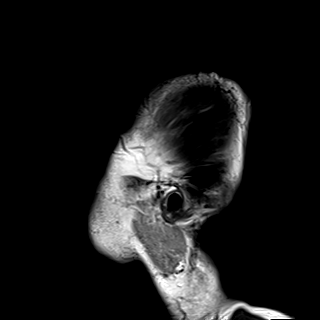
[im 25/25]
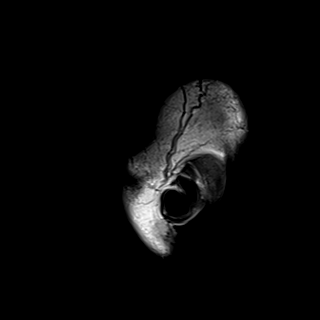

[Series 10: T2 · axial · 5.0mm · 0.72mm/px · z∈[-14,+132]mm · 2 of 26 slices shown]
[im 1/26]
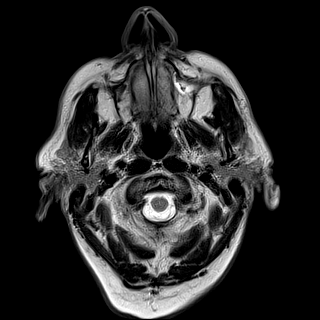
[im 26/26]
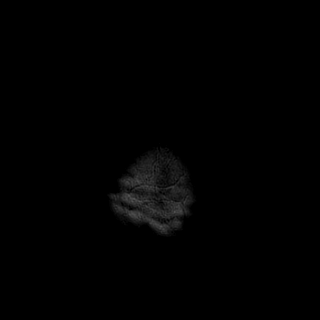

[Series 11: FLAIR · axial · 5.0mm · 0.45mm/px · z∈[-12,+134]mm · 2 of 26 slices shown (1 of 2)]
[im 1/26]
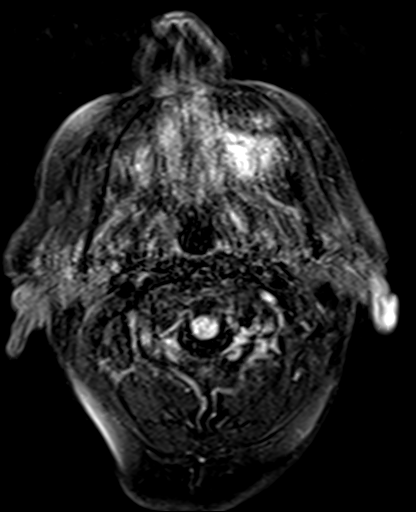
[im 26/26]
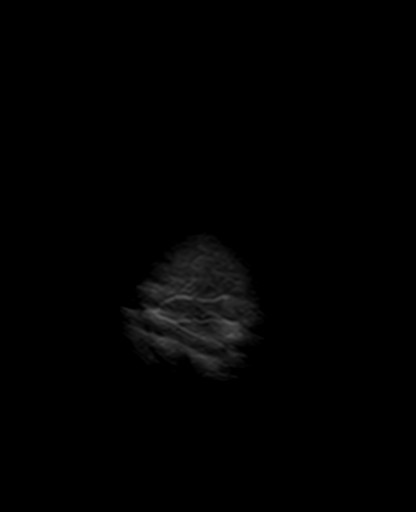

[Series 12: mag_images · axial · 3.0mm · 0.90mm/px · z∈[-25,+147]mm · 5 of 60 slices shown]
[im 1/60]
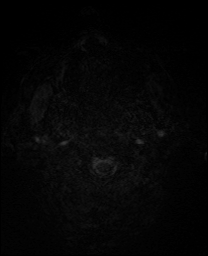
[im 15/60]
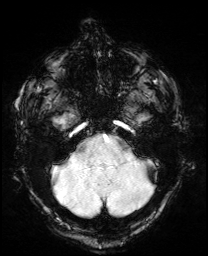
[im 30/60]
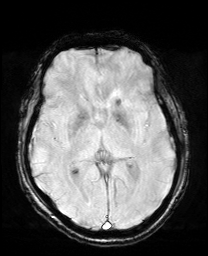
[im 45/60]
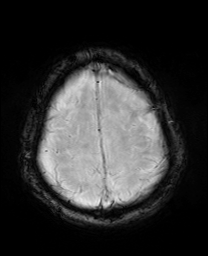
[im 60/60]
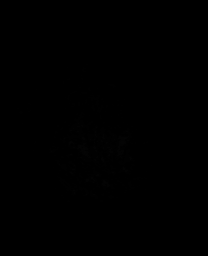

[Series 13: pha_images · axial · 3.0mm · 0.90mm/px · z∈[-22,+147]mm · 5 of 58 slices shown]
[im 1/58]
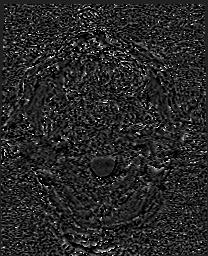
[im 15/58]
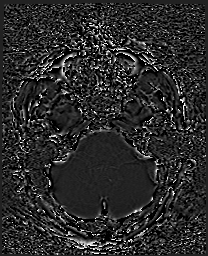
[im 29/58]
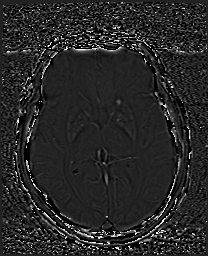
[im 43/58]
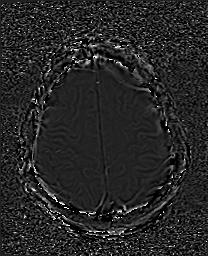
[im 58/58]
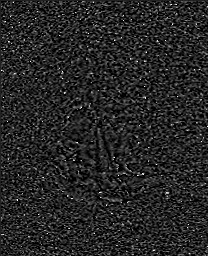

[Series 14: swi_images · axial · 3.0mm · 0.90mm/px · z∈[-25,+147]mm · 5 of 60 slices shown]
[im 1/60]
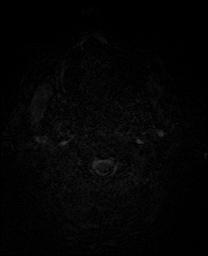
[im 15/60]
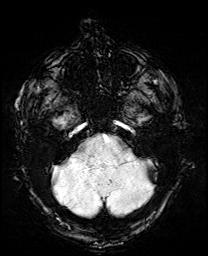
[im 30/60]
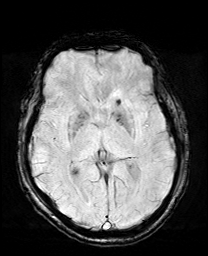
[im 45/60]
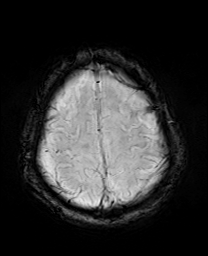
[im 60/60]
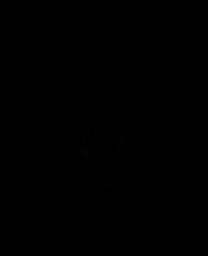

[Series 17: T2 post-contrast · coronal · 5.0mm · 0.72mm/px · 2 of 29 slices shown]
[im 1/29]
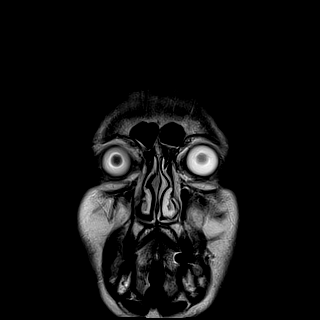
[im 29/29]
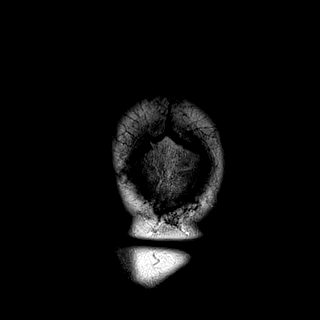

[Series 18: FLAIR · axial · 5.0mm · 0.90mm/px · z∈[-15,+131]mm · 2 of 26 slices shown (2 of 2)]
[im 1/26]
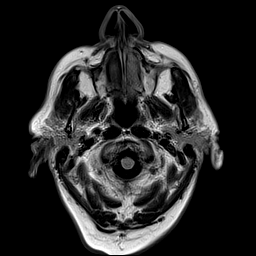
[im 26/26]
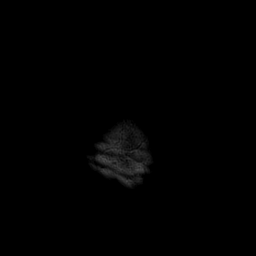

[43 of 48 positions shown; findings below may reference images not displayed]

FINDINGS: Brain: Generalized age-related cerebral atrophy. No significant
cerebral white matter changes for age. Mildly prominent dilated
perivascular spaces noted at the left basal ganglia. No abnormal
foci of restricted diffusion to suggest acute or subacute ischemia.
Gray-white matter differentiation maintained. No encephalomalacia to
suggest chronic infarction. No acute intracranial hemorrhage. Few
small chronic micro hemorrhages noted at the left caudate and right
midbrain.

No mass lesion, midline shift or mass effect. No hydrocephalus. No
extra-axial fluid collection.

Vascular: Major intracranial vascular flow voids maintained.

Skull and upper cervical spine: Craniocervical junction within
normal limits. Multilevel degenerative spondylolysis within the
upper cervical spine with resultant mild to moderate diffuse spinal
stenosis. Bone marrow signal intensity within normal limits. No
scalp soft tissue abnormality.

Sinuses/Orbits: Globes and orbital soft tissues within normal
limits. Scattered mucosal thickening throughout the paranasal
sinuses. Superimposed air-fluid level noted within the left
maxillary sinus. No mastoid effusion. Inner ear structures grossly
normal.

Other: None.
IMPRESSION: 1. No acute intracranial abnormality.
2. Degenerative spondylolysis within the upper cervical spine with
associated mild to moderate diffuse spinal stenosis, incompletely
evaluated on this exam. Finding could be further assessed with
dedicated MRI of the cervical spine as clinically desired.
3. Acute left maxillary sinusitis.

## 2019-09-12 ENCOUNTER — Ambulatory Visit: Payer: Medicare Other

## 2019-09-21 ENCOUNTER — Ambulatory Visit: Payer: Medicare Other | Attending: Internal Medicine

## 2019-09-21 DIAGNOSIS — Z23 Encounter for immunization: Secondary | ICD-10-CM

## 2019-09-21 NOTE — Progress Notes (Signed)
   Covid-19 Vaccination Clinic  Name:  Jose Dyer    MRN: [id number] DOB: [birth year]  09/21/2019  Mr. Mcquary was observed post Covid-19 immunization for 15 minutes without incidence. He was provided with Vaccine Information Sheet and instruction to access the V-Safe system.   Mr. Robley was instructed to call 911 with any severe reactions post vaccine: Marland Kitchen Difficulty breathing  . Swelling of your face and throat  . A fast heartbeat  . A bad rash all over your body  . Dizziness and weakness    Immunizations Administered    Name Date Dose VIS Date Route   Pfizer COVID-19 Vaccine 09/21/2019  1:52 PM 0.3 mL 07/28/2019 Intramuscular   Manufacturer: [place]   Lot: [id number]   [place]: [id number]

## 2019-09-23 ENCOUNTER — Ambulatory Visit: Payer: Medicare Other

## 2019-10-16 ENCOUNTER — Ambulatory Visit: Payer: Medicare Other | Attending: Internal Medicine

## 2019-10-16 DIAGNOSIS — Z23 Encounter for immunization: Secondary | ICD-10-CM

## 2019-10-16 NOTE — Progress Notes (Signed)
   Covid-19 Vaccination Clinic  Name:  Jose Dyer    MRN: [id number] DOB: 05-12-1951  10/16/2019  [name] was observed post Covid-19 immunization for 15 minutes without incidence. He was provided with Vaccine Information Sheet and instruction to access the V-Safe system.   [name] was instructed to call 911 with any severe reactions post vaccine: Marland Kitchen Difficulty breathing  . Swelling of your face and throat  . A fast heartbeat  . A bad rash all over your body  . Dizziness and weakness    Immunizations Administered    Name Date Dose VIS Date Route   Pfizer COVID-19 Vaccine 10/16/2019  4:01 PM 0.3 mL 07/28/2019 Intramuscular   Manufacturer: [place]   Lot: [id number]   [place]: [id number]

## 2019-11-14 ENCOUNTER — Encounter: Payer: Self-pay | Admitting: Cardiology

## 2019-12-19 ENCOUNTER — Other Ambulatory Visit: Payer: Self-pay

## 2019-12-19 MED ORDER — ROSUVASTATIN CALCIUM 5 MG PO TABS: 5.0000 mg | ORAL_TABLET | Freq: Every day | ORAL | 1 refills | Status: DC

## 2020-02-22 DIAGNOSIS — H179 Unspecified corneal scar and opacity: Secondary | ICD-10-CM | POA: Insufficient documentation

## 2020-08-27 DIAGNOSIS — Z961 Presence of intraocular lens: Secondary | ICD-10-CM | POA: Insufficient documentation

## 2020-08-27 DIAGNOSIS — H35033 Hypertensive retinopathy, bilateral: Secondary | ICD-10-CM | POA: Insufficient documentation

## 2020-08-27 DIAGNOSIS — Z947 Corneal transplant status: Secondary | ICD-10-CM | POA: Insufficient documentation

## 2020-08-27 DIAGNOSIS — H2511 Age-related nuclear cataract, right eye: Secondary | ICD-10-CM | POA: Insufficient documentation

## 2020-08-30 ENCOUNTER — Other Ambulatory Visit: Payer: Self-pay | Admitting: Cardiology

## 2020-10-30 ENCOUNTER — Other Ambulatory Visit: Payer: Self-pay

## 2020-12-09 ENCOUNTER — Other Ambulatory Visit: Payer: Self-pay | Admitting: Cardiology

## 2020-12-09 DIAGNOSIS — I25118 Atherosclerotic heart disease of native coronary artery with other forms of angina pectoris: Secondary | ICD-10-CM

## 2020-12-09 DIAGNOSIS — E7800 Pure hypercholesterolemia, unspecified: Secondary | ICD-10-CM

## 2020-12-25 DIAGNOSIS — Z947 Corneal transplant status: Secondary | ICD-10-CM | POA: Diagnosis not present

## 2020-12-25 DIAGNOSIS — Z961 Presence of intraocular lens: Secondary | ICD-10-CM | POA: Diagnosis not present

## 2020-12-25 DIAGNOSIS — H43812 Vitreous degeneration, left eye: Secondary | ICD-10-CM | POA: Diagnosis not present

## 2020-12-25 DIAGNOSIS — H2511 Age-related nuclear cataract, right eye: Secondary | ICD-10-CM | POA: Diagnosis not present

## 2020-12-30 ENCOUNTER — Telehealth: Payer: Self-pay

## 2020-12-30 ENCOUNTER — Other Ambulatory Visit: Payer: Self-pay

## 2020-12-30 DIAGNOSIS — I10 Essential (primary) hypertension: Secondary | ICD-10-CM

## 2020-12-30 DIAGNOSIS — E7800 Pure hypercholesterolemia, unspecified: Secondary | ICD-10-CM | POA: Diagnosis not present

## 2020-12-30 DIAGNOSIS — I25118 Atherosclerotic heart disease of native coronary artery with other forms of angina pectoris: Secondary | ICD-10-CM | POA: Diagnosis not present

## 2020-12-30 NOTE — Telephone Encounter (Signed)
done

## 2020-12-31 LAB — CBC
Hematocrit: 42 % (ref 37.5–51.0)
Hemoglobin: 13.9 g/dL (ref 13.0–17.7)
MCH: 29.4 pg (ref 26.6–33.0)
MCHC: 33.1 g/dL (ref 31.5–35.7)
MCV: 89 fL (ref 79–97)
Platelets: 201 10*3/uL (ref 150–450)
RBC: 4.72 x10E6/uL (ref 4.14–5.80)
RDW: 12.3 % (ref 11.6–15.4)
WBC: 6.6 10*3/uL (ref 3.4–10.8)

## 2020-12-31 LAB — CMP14+EGFR
ALT: 16 IU/L (ref 0–44)
AST: 19 IU/L (ref 0–40)
Albumin/Globulin Ratio: 1.7 (ref 1.2–2.2)
Albumin: 4.6 g/dL (ref 3.7–4.7)
Alkaline Phosphatase: 73 IU/L (ref 44–121)
BUN/Creatinine Ratio: 25 — ABNORMAL HIGH (ref 10–24)
BUN: 23 mg/dL (ref 8–27)
Bilirubin Total: 0.5 mg/dL (ref 0.0–1.2)
CO2: 24 mmol/L (ref 20–29)
Calcium: 9.7 mg/dL (ref 8.6–10.2)
Chloride: 103 mmol/L (ref 96–106)
Creatinine, Ser: 0.93 mg/dL (ref 0.76–1.27)
Globulin, Total: 2.7 g/dL (ref 1.5–4.5)
Glucose: 94 mg/dL (ref 65–99)
Potassium: 4.8 mmol/L (ref 3.5–5.2)
Sodium: 142 mmol/L (ref 134–144)
Total Protein: 7.3 g/dL (ref 6.0–8.5)
eGFR: 87 mL/min/{1.73_m2} (ref >59)

## 2020-12-31 LAB — LIPID PANEL WITH LDL/HDL RATIO
Cholesterol, Total: 108 mg/dL (ref 100–199)
HDL: 44 mg/dL (ref >39)
LDL Chol Calc (NIH): 49 mg/dL (ref 0–99)
LDL/HDL Ratio: 1.1 ratio (ref 0.0–3.6)
Triglycerides: 69 mg/dL (ref 0–149)
VLDL Cholesterol Cal: 15 mg/dL (ref 5–40)

## 2020-12-31 LAB — PSA: Prostate Specific Ag, Serum: 3.2 ng/mL (ref 0.0–4.0)

## 2020-12-31 LAB — LDL CHOLESTEROL, DIRECT: LDL Direct: 53 mg/dL (ref 0–99)

## 2020-12-31 NOTE — Progress Notes (Signed)
Normal CBC, CMP, lipids and also PSA reported separately.  Mild increase in white cells, probably nonspecific.  Will forward a copy to PCP.

## 2021-01-01 ENCOUNTER — Other Ambulatory Visit: Payer: Self-pay

## 2021-01-01 ENCOUNTER — Encounter: Payer: Self-pay | Admitting: Cardiology

## 2021-01-01 ENCOUNTER — Ambulatory Visit: Payer: Medicare Other | Admitting: Cardiology

## 2021-01-01 VITALS — BP 163/83 | HR 57 | Temp 97.9°F | Resp 17 | Ht 68.0 in | Wt 176.8 lb

## 2021-01-01 DIAGNOSIS — Z789 Other specified health status: Secondary | ICD-10-CM | POA: Diagnosis not present

## 2021-01-01 DIAGNOSIS — E7800 Pure hypercholesterolemia, unspecified: Secondary | ICD-10-CM

## 2021-01-01 DIAGNOSIS — I10 Essential (primary) hypertension: Secondary | ICD-10-CM

## 2021-01-01 DIAGNOSIS — I25118 Atherosclerotic heart disease of native coronary artery with other forms of angina pectoris: Secondary | ICD-10-CM | POA: Diagnosis not present

## 2021-01-01 MED ORDER — REPATHA 140 MG/ML ~~LOC~~ SOSY: 1.0000 "pen " | PREFILLED_SYRINGE | SUBCUTANEOUS | 3 refills | Status: DC

## 2021-01-01 MED ORDER — AMLODIPINE BESYLATE 5 MG PO TABS: ORAL_TABLET | ORAL | 3 refills | Status: DC

## 2021-01-01 MED ORDER — NITROGLYCERIN 0.4 MG SL SUBL
0.4000 mg | SUBLINGUAL_TABLET | SUBLINGUAL | 12 refills | Status: DC | PRN
Start: 2021-01-01 — End: 2024-06-22

## 2021-01-01 NOTE — Progress Notes (Signed)
Primary Physician/Referring:  Denita Lung, MD  Patient ID: Jose Dyer, male    DOB: [birth year], 70 y.o.   MRN: [id number]  Chief Complaint  Patient presents with  . Follow-up  . Hyperlipidemia  . Coronary Artery Disease   HPI:    Jose Dyer  is a 70 y.o. male patient with history of stroke without residual defectin 2015, CAD with CTO LAD S/P revascularization of the LAD on 01/27/2017 by retrograde access. He had undergone successful revascularization and stenting of the circumflex coronary artery on 12/14/2016 which is widely patent.  Patient is here to discuss his options for hyperlipidemia. He was previously on Repatha and had significant reduction in LDL with this. He states that he had severe activity limiting back pain. He quit using repatha however he recently started using Repatha back once a month and underwent lab testing and presents for follow-up.  He continues to remain very active and walks anywhere from 8 to 10 miles a day.  I had not seen him for 2 years..   Past Medical History:  Diagnosis Date  . Anginal pain ([place])   . Corneal scarring   . Coronary artery disease   . Dyslipidemia   . FHx: cardiovascular disease   . GERD (gastroesophageal reflux disease)   . History of hiatal hernia   . History of kidney stones    "none since the 1990s" (01/27/2017)  . Hypertension   . Stroke [facility]) 2014   denies residual on 01/27/2017   Past Surgical History:  Procedure Laterality Date  . CATARACT EXTRACTION Left   . COLONOSCOPY  2004  . CORONARY ANGIOPLASTY WITH STENT PLACEMENT  01/27/2017  . CORONARY CTO INTERVENTION N/A 01/27/2017   Procedure: Coronary CTO Intervention;  Surgeon: Jettie Booze, MD;  Location: [place] [facility];  Service: Cardiovascular;  Laterality: N/A;  . CORONARY STENT INTERVENTION  12/15/2016    Successful PTCA and stenting of the distal circumflex with 3.0 x 20 mm Synergy, stenosis reduced from 90% to 0% with maintenance of TIMI-3 to  TIMI-3 flow.  . CORONARY STENT INTERVENTION N/A 12/15/2016   Procedure: Coronary Stent Intervention;  Surgeon: Adrian Prows, MD;  Location: [place] [facility];  Service: Cardiovascular;  Laterality: N/A;  LAD  . CORONARY STENT INTERVENTION N/A 01/27/2017   Procedure: Coronary Stent Intervention;  Surgeon: Jettie Booze, MD;  Location: [place] [facility];  Service: Cardiovascular;  Laterality: N/A;  . EYE TRANSPLANT Right   . LEFT HEART CATH AND CORONARY ANGIOGRAPHY N/A 12/15/2016   Procedure: Left Heart Cath and Coronary Angiography;  Surgeon: Adrian Prows, MD;  Location: [place] [facility];  Service: Cardiovascular;  Laterality: N/A;  . LEFT HEART CATH AND CORONARY ANGIOGRAPHY N/A 01/27/2017   Procedure: Left Heart Cath and Coronary Angiography;  Surgeon: Jettie Booze, MD;  Location: [place] [facility];  Service: Cardiovascular;  Laterality: N/A;   Family History  Problem Relation Age of Onset  . Colon cancer Neg Hx   . Esophageal cancer Neg Hx   . Rectal cancer Neg Hx   . Stomach cancer Neg Hx     Social History   Tobacco Use  . Smoking status: Never Smoker  . Smokeless tobacco: Never Used  Substance Use Topics  . Alcohol use: No   Marital Status: Single  ROS  Review of Systems  Cardiovascular: Negative for chest pain, dyspnea on exertion and leg swelling.  Gastrointestinal: Negative for melena.   Objective  Blood pressure Marland Kitchen)  163/83, pulse (!) 57, temperature 97.9 F (36.6 C), temperature source Temporal, resp. rate 17, height 5\' 8"  (1.727 m), weight 176 lb 12.8 oz (80.2 kg), SpO2 98 %. Body mass index is 26.88 kg/m.  Vitals with BMI 01/01/2021 01/01/2021 05/30/2019  Height - 5\' 8"  -  Weight - 176 lbs 13 oz -  BMI - 24.23 -  Systolic 536 144 315  Diastolic 83 80 79  Pulse 57 59 60     Physical Exam Neck:     Vascular: No carotid bruit or JVD.  Cardiovascular:     Rate and Rhythm: Normal rate and regular rhythm.     Pulses: Intact distal pulses.     Heart sounds:  Normal heart sounds. No murmur heard. No gallop.   Pulmonary:     Effort: Pulmonary effort is normal.     Breath sounds: Normal breath sounds.  Abdominal:     General: Bowel sounds are normal.     Palpations: Abdomen is soft.  Musculoskeletal:        General: No swelling.     Laboratory examination:   Recent Labs    12/30/20 1312  NA 142  K 4.8  CL 103  CO2 24  GLUCOSE 94  BUN 23  CREATININE 0.93  CALCIUM 9.7   estimated creatinine clearance is 69.5 mL/min (by C-G formula based on SCr of 0.93 mg/dL).  CMP Latest Ref Rng & Units 12/30/2020 09/04/2018 01/28/2017  Glucose 65 - 99 mg/dL 94 114(H) 111(H)  BUN 8 - 27 mg/dL 23 25(H) 17  Creatinine 0.76 - 1.27 mg/dL 0.93 0.96 1.03  Sodium 134 - 144 mmol/L 142 139 138  Potassium 3.5 - 5.2 mmol/L 4.8 4.0 4.0  Chloride 96 - 106 mmol/L 103 103 106  CO2 20 - 29 mmol/L 24 24 26   Calcium 8.6 - 10.2 mg/dL 9.7 9.5 8.6(L)  Total Protein 6.0 - 8.5 g/dL 7.3 6.6 -  Total Bilirubin 0.0 - 1.2 mg/dL 0.5 0.6 -  Alkaline Phos 44 - 121 IU/L 73 53 -  AST 0 - 40 IU/L 19 21 -  ALT 0 - 44 IU/L 16 22 -   CBC Latest Ref Rng & Units 12/30/2020 09/04/2018 01/28/2017  WBC 3.4 - 10.8 x10E3/uL 6.6 11.1(H) 7.5  Hemoglobin 13.0 - 17.7 g/dL 13.9 14.8 10.1(L)  Hematocrit 37.5 - 51.0 % 42.0 44.7 30.8(L)  Platelets 150 - 450 x10E3/uL 201 244 189   Lipid Panel Recent Labs    12/30/20 1312 12/30/20 1313  CHOL 108  --   TRIG 69  --   LDLCALC 49  --   HDL 44  --   LDLDIRECT  --  53   HEMOGLOBIN A1C Lab Results  Component Value Date   HGBA1C 5.5 01/30/2014   MPG 111 01/30/2014   TSH No results for input(s): TSH in the last 8760 hours.     Ref Range & Units 12/30/2020  Prostate Specific Ag, Serum 0.0 - 4.0 ng/mL 3.2      Medications and allergies   Allergies  Allergen Reactions  . Statins Other (See Comments)    Muscle tightening     Current Outpatient Medications on File Prior to Visit  Medication Sig Dispense Refill  . amLODipine  (NORVASC) 5 MG tablet TAKE 1 TABLET BY MOUTH IN  THE EVENING AFTER DINNER 90 tablet 3  . aspirin 81 MG tablet Take 81 mg by mouth daily.    . Multiple Vitamin (MULTI-VITAMIN) tablet Take 1 tablet by mouth  daily.    . nitroGLYCERIN (NITROSTAT) 0.4 MG SL tablet Place 1 tablet (0.4 mg total) under the tongue every 5 (five) minutes as needed for chest pain. 25 tablet 12  . pantoprazole (PROTONIX) 40 MG tablet TAKE 1 TABLET BY MOUTH  DAILY 90 tablet 3  . prednisoLONE acetate (PRED FORTE) 1 % ophthalmic suspension Place 1 drop into the right eye in the morning and at bedtime.    Marland Kitchen Propylene Glycol 0.6 % SOLN Place 1 application into the right eye daily as needed.     No current facility-administered medications on file prior to visit.     Radiology:   No results found.  Cardiac Studies:   Coronary angiogram by Dr. Irish Lack 01/27/2017: CTO Prox LAD to Mid LAD lesion, 100 %stenosed, right to left collaterals.Successful CTO PCI with a SYNERGY DES 3.5X38 drug eluting stent which was successfully placed, postdilated to > 4 mm. Distal Cx 3.0x20 mm Synergy DES placed 12/14/2016 widely patent. Normal LVEF.  Echocardiogram 09/23/2016: Left ventricle cavity is normal in size. Moderate concentric hypertrophy of the left ventricle. Normal global wall motion. Doppler evidence of grade II (pseudonormal) diastolic dysfunction. Diastolic dysfunction findings suggests elevated LA/LV end diastolic pressure. Calculated EF 66%. Left atrial cavity is severely dilated at 5.1 cm. Aneurysmal interatrial septum without PFO. Right atrial cavity is moderately dilated. Trileaflet aortic valve with mild to moderate regurgitation. Miotral valve apperas normal. There is mild to moderate regurgitation. Mild to moderate tricuspid regurgitation. Mild to moderate pulmonary hypertension. Pulmonary artery systolic pressure is estimated at 40 mm Hg. IVC is dilated with respiratory variation. Suggests mild elevation in right  hear  Carotid duplex 01/24/2014: Minimal amount of bilateral intimal wall thickening right greater than the left.   EKG:     EKG 01/01/2021: Sinus bradycardia at rate of 54 bpm, poor R wave progression, cannot exclude anteroseptal infarct old.  No evidence of ischemia.     Assessment     ICD-10-CM   1. Atherosclerosis of native coronary artery of native heart with stable angina pectoris ([place])  I25.118   2. Hypercholesteremia  E78.00   3. Primary hypertension  I10 EKG 12-Lead  4. Statin intolerance  Z78.9     Medications Discontinued During This Encounter  Medication Reason  . rosuvastatin (CRESTOR) 5 MG tablet Error    No orders of the defined types were placed in this encounter.  Orders Placed This Encounter  Procedures  . EKG 12-Lead   Recommendations:   Tavion Berberian is a 70 y.o. male patient with history of stroke without residual defectin 2015, CAD with CTO LAD S/P revascularization of the LAD on 01/27/2017 by retrograde access. He had undergone successful revascularization and stenting of the circumflex coronary artery on 12/14/2016 which is widely patent.  Patient is here to discuss his options for hyperlipidemia. He was previously on Repatha and had significant reduction in LDL with this. He states that he had severe activity limiting back pain.  Hence he has been taking Repatha only once a month.  Underwent labs presents for follow-up.  Patient remains completely asymptomatic and is walking at least 8 to 10 miles a day.  Physical examination is unremarkable and EKG is unremarkable.  I reviewed his labs, normal CMP, CBC and also lipids.  Although he is taking Repatha once a month at 140 mg dose instead of every 2 weeks, lipids are well controlled.  Hence I have recommended that we continue the same dose and Rx has been sent.  Is  willing to take once a month as he does not have any myalgias compared to taking it twice a month.  No changes in the medications were done today.   Blood pressure is also well controlled at home recordings and he has been out of Amlodipine for a few days.  I will see him back in a year.    Adrian Prows, MD, [facility] 01/01/2021, 4:37 PM Office: [phone]

## 2021-01-06 ENCOUNTER — Other Ambulatory Visit: Payer: Self-pay

## 2021-01-06 MED ORDER — PANTOPRAZOLE SODIUM 40 MG PO TBEC: 1.0000 | DELAYED_RELEASE_TABLET | Freq: Every day | ORAL | 3 refills | Status: DC

## 2021-01-21 ENCOUNTER — Other Ambulatory Visit: Payer: Self-pay

## 2021-01-21 DIAGNOSIS — I25118 Atherosclerotic heart disease of native coronary artery with other forms of angina pectoris: Secondary | ICD-10-CM

## 2021-01-21 DIAGNOSIS — E7800 Pure hypercholesterolemia, unspecified: Secondary | ICD-10-CM

## 2021-01-22 ENCOUNTER — Other Ambulatory Visit: Payer: Self-pay

## 2021-01-22 DIAGNOSIS — I25118 Atherosclerotic heart disease of native coronary artery with other forms of angina pectoris: Secondary | ICD-10-CM

## 2021-01-22 DIAGNOSIS — E7800 Pure hypercholesterolemia, unspecified: Secondary | ICD-10-CM

## 2021-01-22 MED ORDER — REPATHA 140 MG/ML ~~LOC~~ SOSY: 1.0000 "pen " | PREFILLED_SYRINGE | SUBCUTANEOUS | 3 refills | Status: DC

## 2021-01-29 DIAGNOSIS — Z961 Presence of intraocular lens: Secondary | ICD-10-CM | POA: Diagnosis not present

## 2021-01-29 DIAGNOSIS — Z947 Corneal transplant status: Secondary | ICD-10-CM | POA: Diagnosis not present

## 2021-01-29 DIAGNOSIS — H43812 Vitreous degeneration, left eye: Secondary | ICD-10-CM | POA: Diagnosis not present

## 2021-01-29 DIAGNOSIS — H2511 Age-related nuclear cataract, right eye: Secondary | ICD-10-CM | POA: Diagnosis not present

## 2021-02-05 ENCOUNTER — Ambulatory Visit (INDEPENDENT_AMBULATORY_CARE_PROVIDER_SITE_OTHER): Payer: Medicare Other | Admitting: Family Medicine

## 2021-02-05 ENCOUNTER — Encounter: Payer: Self-pay | Admitting: Family Medicine

## 2021-02-05 ENCOUNTER — Other Ambulatory Visit: Payer: Self-pay

## 2021-02-05 VITALS — BP 148/82 | HR 46 | Temp 96.2°F | Ht 68.0 in | Wt 178.0 lb

## 2021-02-05 DIAGNOSIS — M461 Sacroiliitis, not elsewhere classified: Secondary | ICD-10-CM

## 2021-02-05 NOTE — Patient Instructions (Signed)
Heat for 20 minutes 3 times per day.  Gentle stretching after that.  Take 2 Aleve twice per day and you can also take as much as 2 Tylenol 4 times per day.

## 2021-02-05 NOTE — Progress Notes (Signed)
   Subjective:    Patient ID: Jose Dyer, male    DOB: November 22, 1950, 70 y.o.   MRN: [id number]  HPI He states that last Tuesday he bent over and felt a twinge in his left low back area.  No numbness, tingling, weakness.  He does have a previous history of L5 disc with a discectomy.   Review of Systems     Objective:   Physical Exam Alert and in no distress.  Tender to palpation over the left upper SI joint.  FABER testing was positive.  Negative straight leg raising.  Normal hip motion.  Normal DTRs.       Assessment & Plan:   Sacroiliitis ([place]) I explained the problem to him and the fact it is not disc or nerve root problem.Heat for 20 minutes 3 times per day.  Gentle stretching after that.  Take 2 Aleve twice per day and you can also take as much as 2 Tylenol 4 times per day. I discussed chiropractic manipulation and also physical therapy could potentially help with this.  We will try conservative care first.  He was comfortable with that.

## 2021-04-23 DIAGNOSIS — Z947 Corneal transplant status: Secondary | ICD-10-CM | POA: Diagnosis not present

## 2021-04-23 DIAGNOSIS — H43812 Vitreous degeneration, left eye: Secondary | ICD-10-CM | POA: Diagnosis not present

## 2021-04-23 DIAGNOSIS — H2511 Age-related nuclear cataract, right eye: Secondary | ICD-10-CM | POA: Diagnosis not present

## 2021-04-23 DIAGNOSIS — Z961 Presence of intraocular lens: Secondary | ICD-10-CM | POA: Diagnosis not present

## 2021-05-13 ENCOUNTER — Other Ambulatory Visit: Payer: Self-pay

## 2021-05-13 ENCOUNTER — Ambulatory Visit (INDEPENDENT_AMBULATORY_CARE_PROVIDER_SITE_OTHER): Payer: Medicare Other | Admitting: Family Medicine

## 2021-05-13 VITALS — BP 142/76 | HR 57 | Temp 96.7°F | Ht 67.75 in | Wt 175.2 lb

## 2021-05-13 DIAGNOSIS — Z0000 Encounter for general adult medical examination without abnormal findings: Secondary | ICD-10-CM | POA: Diagnosis not present

## 2021-05-13 DIAGNOSIS — Z23 Encounter for immunization: Secondary | ICD-10-CM | POA: Diagnosis not present

## 2021-05-13 DIAGNOSIS — I6389 Other cerebral infarction: Secondary | ICD-10-CM | POA: Diagnosis not present

## 2021-05-13 DIAGNOSIS — E785 Hyperlipidemia, unspecified: Secondary | ICD-10-CM

## 2021-05-13 DIAGNOSIS — I10 Essential (primary) hypertension: Secondary | ICD-10-CM

## 2021-05-13 DIAGNOSIS — Z789 Other specified health status: Secondary | ICD-10-CM | POA: Diagnosis not present

## 2021-05-13 DIAGNOSIS — I25118 Atherosclerotic heart disease of native coronary artery with other forms of angina pectoris: Secondary | ICD-10-CM

## 2021-05-13 DIAGNOSIS — Z125 Encounter for screening for malignant neoplasm of prostate: Secondary | ICD-10-CM

## 2021-05-13 DIAGNOSIS — K219 Gastro-esophageal reflux disease without esophagitis: Secondary | ICD-10-CM

## 2021-05-13 NOTE — Progress Notes (Signed)
Jose Dyer is a 70 y.o. male who presents for annual wellness visit,CPE and follow-up on chronic medical conditions.  He has no particular concerns or complaints.  He sees cardiology regularly.  He is presently having no chest pain, shortness of breath or PND.  He walks regularly playing golf.  Presently he is on Repatha and doing well on that.  He does have a history of colonic polyps and is in need of a follow-up colonoscopy.  His reflux seems to be under good control using Protonix.  He continues on amlodipine for his blood pressure. He does have a previous history of CVA but presently is having no residual neurologic symptoms.  He would like screening for prostate cancer.  He does not drink or smoke.  Immunizations and Health Maintenance Immunization History  Administered Date(s) Administered   Fluad Quad(high Dose 65+) 05/13/2021   Influenza, High Dose Seasonal PF 05/23/2014   Influenza-Unspecified 06/15/2015, 06/30/2016   PFIZER(Purple Top)SARS-COV-2 Vaccination 09/21/2019, 10/16/2019, 05/11/2020, 12/24/2020   Pfizer Covid-19 Vaccine Bivalent Booster 76yrs & up 05/13/2021   Pneumococcal Conjugate-13 06/28/2014   Pneumococcal Polysaccharide-23 12/16/2015   Tdap 09/26/2008   Zoster, Live 11/16/2011   Health Maintenance Due  Topic Date Due   Zoster Vaccines- Shingrix (1 of 2) Never done   TETANUS/TDAP  09/26/2018   COVID-19 Vaccine (5 - Booster for Pfizer series) 04/26/2021    Last colonoscopy:2019 Last IZT:[id number] Dentist:? Ophtho:? Exercise: Walking regularly playing golf.  Other doctors caring for patient include:[facility]  Advanced Directives: Does Patient Have a Medical Advance Directive?: Yes Type of Advance Directive: Healthcare Power of Attorney, Living will, Out of facility DNR (pink MOST or yellow form) Copy of [place] in Chart?: No - copy requested  Depression screen:  See questionnaire below.     Depression screen [facility] 2/9 05/13/2021 02/05/2021  12/16/2015 12/21/2013 12/15/2012  Decreased Interest 0 0 0 0 0  Down, Depressed, Hopeless 0 0 0 0 0  PHQ - 2 Score 0 0 0 0 0    Fall Screen: See Questionaire below.   Fall Risk  05/13/2021 02/05/2021 12/16/2015 12/21/2013  Falls in the past year? 0 0 No No  Number falls in past yr: 0 0 - -  Injury with Fall? 0 0 - -  Risk for fall due to : No Fall Risks No Fall Risks - -  Follow up Falls evaluation completed Falls evaluation completed - -    ADL screen:  See questionnaire below.  Functional Status Survey: Is the patient deaf or have difficulty hearing?: No Does the patient have difficulty seeing, even when wearing glasses/contacts?: No Does the patient have difficulty concentrating, remembering, or making decisions?: No Does the patient have difficulty walking or climbing stairs?: No Does the patient have difficulty dressing or bathing?: No Does the patient have difficulty doing errands alone such as visiting a doctor's office or shopping?: No   Review of Systems  Constitutional: -, -unexpected weight change, -anorexia, -fatigue Allergy: -sneezing, -itching, -congestion Dermatology: denies changing moles, rash, lumps ENT: -runny nose, -ear pain, -sore throat,  Cardiology:  -chest pain, -palpitations, -orthopnea, Respiratory: -cough, -shortness of breath, -dyspnea on exertion, -wheezing,  Gastroenterology: -abdominal pain, -nausea, -vomiting, -diarrhea, -constipation, -dysphagia Hematology: -bleeding or bruising problems Musculoskeletal: -arthralgias, -myalgias, -joint swelling, -back pain, - Ophthalmology: -vision changes,  Urology: -dysuria, -difficulty urinating,  -urinary frequency, -urgency, incontinence Neurology: -, -numbness, , -memory loss, -falls, -dizziness    PHYSICAL EXAM:  BP (!) 142/76   Pulse (!) 57  Temp (!) 96.7 F (35.9 C)   Ht 5' 7.75" (1.721 m)   Wt 175 lb 3.2 oz (79.5 kg)   SpO2 97%   BMI 26.84 kg/m   General Appearance: Alert, cooperative, no  distress, appears stated age Head: Normocephalic, without obvious abnormality, atraumatic Eyes: PERRL, conjunctiva/corneas clear, EOM's intact, Ears: Normal TM's and external ear canals Nose: Nares normal, mucosa normal, no drainage or sinus   tenderness Throat: Lips, mucosa, and tongue normal; teeth and gums normal Neck: Supple, no lymphadenopathy, thyroid:no enlargement/tenderness/nodules; no carotid bruit or JVD Lungs: Clear to auscultation bilaterally without wheezes, rales or ronchi; respirations unlabored Heart: Regular rate and rhythm, S1 and S2 normal, no murmur, rub or gallop Abdomen: Soft, non-tender, nondistended, normoactive bowel sounds, no masses, no hepatosplenomegaly Extremities: No clubbing, cyanosis or edema Pulses: 2+ and symmetric all extremities Skin: Skin color, texture, turgor normal, no rashes or lesions Lymph nodes: Cervical, supraclavicular, and axillary nodes normal Neurologic: CNII-XII intact, normal strength, sensation and gait; reflexes 2+ and symmetric throughout   Psych: Normal mood, affect, hygiene and grooming Recent blood work was reviewed. ASSESSMENT/PLAN: Routine general medical examination at a health care facility  Coronary artery disease of native artery of native heart with stable angina pectoris ([place])  Cerebral infarction due to other mechanism ([place])  Statin intolerance  Hyperlipidemia LDL goal <100  Primary hypertension  Need for influenza vaccination - Plan: Flu Vaccine QUAD High Dose(Fluad)  Need for COVID-19 vaccine - Plan: Pension scheme manager  Screening for prostate cancer - Plan: PSA  Gastroesophageal reflux disease without esophagitis  Encouraged him to continue to take excellent care of himself.  Recommend he try taking the Protonix on an every other day basis. Discussed taking Repatha less often and discussed the half-life of the drug. Discussed PSA screening (risks/benefits), recommended at least 30  minutes of aerobic activity at least 5 days/week;  Immunization recommendations discussed.  He is to get Shingrix and Tdap at the drugstore.  Colonoscopy recommendations reviewed.   Medicare Attestation I have personally reviewed: The patient's medical and social history Their use of alcohol, tobacco or illicit drugs Their current medications and supplements The patient's functional ability including ADLs,fall risks, home safety risks, cognitive, and hearing and visual impairment Diet and physical activities Evidence for depression or mood disorders  The patient's weight, height, and BMI have been recorded in the chart.  I have made referrals, counseling, and provided education to the patient based on review of the above and I have provided the patient with a written personalized care plan for preventive services.     Jill Alexanders, MD   05/13/2021

## 2021-05-14 LAB — PSA: Prostate Specific Ag, Serum: 3.7 ng/mL (ref 0.0–4.0)

## 2021-05-14 NOTE — Addendum Note (Signed)
Addended by: Denita Lung on: 05/14/2021 08:26 AM   Modules accepted: Orders

## 2021-07-01 DIAGNOSIS — H2511 Age-related nuclear cataract, right eye: Secondary | ICD-10-CM | POA: Diagnosis not present

## 2021-07-01 DIAGNOSIS — R011 Cardiac murmur, unspecified: Secondary | ICD-10-CM | POA: Diagnosis not present

## 2021-07-08 DIAGNOSIS — H25811 Combined forms of age-related cataract, right eye: Secondary | ICD-10-CM | POA: Diagnosis not present

## 2021-07-08 DIAGNOSIS — I2510 Atherosclerotic heart disease of native coronary artery without angina pectoris: Secondary | ICD-10-CM | POA: Diagnosis not present

## 2021-07-08 DIAGNOSIS — K219 Gastro-esophageal reflux disease without esophagitis: Secondary | ICD-10-CM | POA: Diagnosis not present

## 2021-07-08 DIAGNOSIS — R001 Bradycardia, unspecified: Secondary | ICD-10-CM | POA: Diagnosis not present

## 2021-07-08 DIAGNOSIS — R011 Cardiac murmur, unspecified: Secondary | ICD-10-CM | POA: Diagnosis not present

## 2021-07-08 DIAGNOSIS — I10 Essential (primary) hypertension: Secondary | ICD-10-CM | POA: Diagnosis not present

## 2021-07-09 DIAGNOSIS — H2511 Age-related nuclear cataract, right eye: Secondary | ICD-10-CM | POA: Diagnosis not present

## 2021-07-28 ENCOUNTER — Other Ambulatory Visit: Payer: Self-pay

## 2021-07-28 ENCOUNTER — Telehealth: Payer: Self-pay

## 2021-07-28 DIAGNOSIS — R972 Elevated prostate specific antigen [PSA]: Secondary | ICD-10-CM

## 2021-07-28 NOTE — Telephone Encounter (Signed)
Pt. Called per JCL to request a referral for a Urologist he has had an elevated PSA in the past and his symptoms seem to be getting worse.

## 2021-07-28 NOTE — Telephone Encounter (Signed)
Done KH 

## 2021-08-04 ENCOUNTER — Telehealth: Payer: Self-pay

## 2021-08-04 ENCOUNTER — Other Ambulatory Visit: Payer: Self-pay

## 2021-08-04 DIAGNOSIS — I25118 Atherosclerotic heart disease of native coronary artery with other forms of angina pectoris: Secondary | ICD-10-CM

## 2021-08-04 DIAGNOSIS — E7800 Pure hypercholesterolemia, unspecified: Secondary | ICD-10-CM

## 2021-08-04 MED ORDER — REPATHA 140 MG/ML ~~LOC~~ SOSY: 1.0000 "pen " | PREFILLED_SYRINGE | SUBCUTANEOUS | 3 refills | Status: DC

## 2021-08-04 NOTE — Telephone Encounter (Signed)
PA for repatha submitted on 08/04/2021.

## 2021-10-05 ENCOUNTER — Other Ambulatory Visit: Payer: Self-pay | Admitting: Cardiology

## 2021-10-05 DIAGNOSIS — I10 Essential (primary) hypertension: Secondary | ICD-10-CM

## 2021-10-08 ENCOUNTER — Other Ambulatory Visit: Payer: Self-pay

## 2021-10-08 ENCOUNTER — Encounter (HOSPITAL_COMMUNITY): Payer: Self-pay

## 2021-10-08 ENCOUNTER — Ambulatory Visit (HOSPITAL_COMMUNITY)
Admission: EM | Admit: 2021-10-08 | Discharge: 2021-10-08 | Disposition: A | Discharge status: Home / Self Care | Payer: Medicare Other | Attending: Family Medicine | Admitting: Family Medicine

## 2021-10-08 DIAGNOSIS — H6501 Acute serous otitis media, right ear: Secondary | ICD-10-CM | POA: Diagnosis not present

## 2021-10-08 MED ORDER — AMOXICILLIN 875 MG PO TABS: 875.0000 mg | ORAL_TABLET | Freq: Two times a day (BID) | ORAL | 0 refills | Status: AC

## 2021-10-08 NOTE — Discharge Instructions (Addendum)
I think you have fluid behind the right eardrum  Take amoxicillin 875 mg 2 times daily for 7 days.  Please go see your primary doctor in 1 to 2 weeks, especially if you are not improving lately

## 2021-10-08 NOTE — ED Provider Notes (Signed)
[place]    CSN: [id number] Arrival date & time: 10/08/21  1458      History   Chief Complaint Chief Complaint  Patient presents with   Ear Pain    HPI Jose Dyer is a 71 y.o. male.   HPI Here for some right ear pain that is been bothering him in the last 10 days sometimes will have some pain superior to the right ear.  No fever, no cough or congestion, he did have some right-sided throat pain started today.  Past medical history is significant for hypertension and high cholesterol  Past Medical History:  Diagnosis Date   Anginal pain ([place])    Corneal scarring    Coronary artery disease    Dyslipidemia    FHx: cardiovascular disease    GERD (gastroesophageal reflux disease)    History of hiatal hernia    History of kidney stones    "none since the 1990s" (01/27/2017)   Hypertension    Stroke ([place]) 2014   denies residual on 01/27/2017    Patient Active Problem List   Diagnosis Date Noted   Gastroesophageal reflux disease without esophagitis 05/13/2021   Coronary artery disease: Cath a. (May 2018) succ PTCA of distal circ, unsucc at CTO prox LAD. b. (June 2018) successful CTO of prox to mid LAD  01/28/2017   Post PTCA 12/15/2016   Corneal scarring 12/16/2015   Statin intolerance 06/28/2014   Cerebral infarction ([place]) 01/30/2014   Hyperlipidemia LDL goal <100 01/30/2014   Hypertension 12/15/2012    Past Surgical History:  Procedure Laterality Date   CATARACT EXTRACTION Left    COLONOSCOPY  2004   CORONARY ANGIOPLASTY WITH STENT PLACEMENT  01/27/2017   CORONARY CTO INTERVENTION N/A 01/27/2017   Procedure: Coronary CTO Intervention;  Surgeon: Jettie Booze, MD;  Location: [place] [facility];  Service: Cardiovascular;  Laterality: N/A;   CORONARY STENT INTERVENTION  12/15/2016    Successful PTCA and stenting of the distal circumflex with 3.0 x 20 mm Synergy, stenosis reduced from 90% to 0% with maintenance of TIMI-3 to TIMI-3 flow.   CORONARY  STENT INTERVENTION N/A 12/15/2016   Procedure: Coronary Stent Intervention;  Surgeon: Adrian Prows, MD;  Location: [place] [facility];  Service: Cardiovascular;  Laterality: N/A;  LAD   CORONARY STENT INTERVENTION N/A 01/27/2017   Procedure: Coronary Stent Intervention;  Surgeon: Jettie Booze, MD;  Location: [place] [facility];  Service: Cardiovascular;  Laterality: N/A;   EYE TRANSPLANT Right    LEFT HEART CATH AND CORONARY ANGIOGRAPHY N/A 12/15/2016   Procedure: Left Heart Cath and Coronary Angiography;  Surgeon: Adrian Prows, MD;  Location: [place] [facility];  Service: Cardiovascular;  Laterality: N/A;   LEFT HEART CATH AND CORONARY ANGIOGRAPHY N/A 01/27/2017   Procedure: Left Heart Cath and Coronary Angiography;  Surgeon: Jettie Booze, MD;  Location: [place] [facility];  Service: Cardiovascular;  Laterality: N/A;       Home Medications    Prior to Admission medications   Medication Sig Start Date End Date Taking? Authorizing Provider  amoxicillin (AMOXIL) 875 MG tablet Take 1 tablet (875 mg total) by mouth 2 (two) times daily for 7 days. 10/08/21 10/15/21 Yes Barrett Henle, MD  amLODipine (NORVASC) 5 MG tablet TAKE 1 TABLET BY MOUTH IN  THE EVENING AFTER DINNER 10/06/21   Adrian Prows, MD  aspirin 81 MG tablet Take 81 mg by mouth daily.    [provider]  Evolocumab (REPATHA)  140 MG/ML SOSY Inject 1 pen into the skin every 30 (thirty) days. 08/04/21   Adrian Prows, MD  Multiple Vitamin (MULTI-VITAMIN) tablet Take 1 tablet by mouth daily.    [provider]  nitroGLYCERIN (NITROSTAT) 0.4 MG SL tablet Place 1 tablet (0.4 mg total) under the tongue every 5 (five) minutes as needed for chest pain. 01/01/21   Adrian Prows, MD  pantoprazole (PROTONIX) 40 MG tablet Take 1 tablet (40 mg total) by mouth daily. 01/06/21   Adrian Prows, MD  prednisoLONE acetate (PRED FORTE) 1 % ophthalmic suspension Place 1 drop into the right eye in the morning and at bedtime. 07/16/20   [provider]  Propylene Glycol 0.6 % SOLN Place 1 application into the right eye daily as needed.    [provider]    Family History Family History  Problem Relation Age of Onset   Colon cancer Neg Hx    Esophageal cancer Neg Hx    Rectal cancer Neg Hx    Stomach cancer Neg Hx     Social History Social History   Tobacco Use   Smoking status: Never   Smokeless tobacco: Never  Vaping Use   Vaping Use: Never used  Substance Use Topics   Alcohol use: No   Drug use: No     Allergies   Statins   Review of Systems Review of Systems   Physical Exam Triage Vital Signs ED Triage Vitals  Enc Vitals Group     BP 10/08/21 1537 139/76     Pulse Rate 10/08/21 1537 60     Resp 10/08/21 1537 18     Temp --      Temp src --      SpO2 10/08/21 1537 96 %     Weight --      Height --      Head Circumference --      Peak Flow --      Pain Score 10/08/21 1536 5     Pain Loc --      Pain Edu? --      Excl. in [place]? --    No data found.  Updated Vital Signs BP 139/76 (BP Location: Right Arm)    Pulse 60    Resp 18    SpO2 96%   Visual Acuity Right Eye Distance:   Left Eye Distance:   Bilateral Distance:    Right Eye Near:   Left Eye Near:    Bilateral Near:     Physical Exam Vitals reviewed.  Constitutional:      General: He is not in acute distress.    Appearance: He is not toxic-appearing.  HENT:     Right Ear: Ear canal normal.     Left Ear: Tympanic membrane and ear canal normal.     Ears:     Comments: Right TM is dull and gray.    Nose: Nose normal.     Mouth/Throat:     Mouth: Mucous membranes are moist.     Pharynx: No oropharyngeal exudate.     Comments: There is very mild erythema of the posterior oropharynx Eyes:     Extraocular Movements: Extraocular movements intact.     Conjunctiva/sclera: Conjunctivae normal.     Pupils: Pupils are equal, round, and reactive to light.  Cardiovascular:     Rate and Rhythm: Normal rate and regular  rhythm.     Heart sounds: No murmur heard. Pulmonary:     Effort:  Pulmonary effort is normal.     Breath sounds: Normal breath sounds.  Musculoskeletal:     Cervical back: Neck supple.  Lymphadenopathy:     Cervical: No cervical adenopathy.  Skin:    Capillary Refill: Capillary refill takes less than 2 seconds.     Coloration: Skin is not jaundiced or pale.  Neurological:     General: No focal deficit present.     Mental Status: He is alert and oriented to person, place, and time.  Psychiatric:        Behavior: Behavior normal.     UC Treatments / Results  Labs (all labs ordered are listed, but only abnormal results are displayed) Labs Reviewed - No data to display  EKG   Radiology No results found.  Procedures Procedures (including critical care time)  Medications Ordered in UC Medications - No data to display  Initial Impression / Assessment and Plan / UC Course  I have reviewed the triage vital signs and the nursing notes.  Pertinent labs & imaging results that were available during my care of the patient were reviewed by me and considered in my medical decision making (see chart for details).    Discussed that the serous otitis media most likely would not necessarily need antibiotics, but patient would like to try taking a weeks worth to see if it will help.  He will see his primary office in 1 to 2 weeks if not improving  Final Clinical Impressions(s) / UC Diagnoses   Final diagnoses:  Right acute serous otitis media, recurrence not specified     Discharge Instructions      I think you have fluid behind the right eardrum  Take amoxicillin 875 mg 2 times daily for 7 days.  Please go see your primary doctor in 1 to 2 weeks, especially if you are not improving lately     ED Prescriptions     Medication Sig Dispense Auth. Provider   amoxicillin (AMOXIL) 875 MG tablet Take 1 tablet (875 mg total) by mouth 2 (two) times daily for 7 days. 14 tablet  Seif Teichert, Gwenlyn Perking, MD      PDMP not reviewed this encounter.   Barrett Henle, MD 10/08/21 [phone]

## 2021-10-08 NOTE — ED Triage Notes (Signed)
Pt reports he has a inner ear infection and states he is here to get an antibiotic prescribed.

## 2021-10-15 ENCOUNTER — Other Ambulatory Visit: Payer: Self-pay | Admitting: Cardiology

## 2021-11-13 DIAGNOSIS — H35372 Puckering of macula, left eye: Secondary | ICD-10-CM | POA: Diagnosis not present

## 2021-11-28 DIAGNOSIS — H35372 Puckering of macula, left eye: Secondary | ICD-10-CM | POA: Diagnosis not present

## 2021-11-28 DIAGNOSIS — H35342 Macular cyst, hole, or pseudohole, left eye: Secondary | ICD-10-CM | POA: Diagnosis not present

## 2021-11-28 DIAGNOSIS — H35351 Cystoid macular degeneration, right eye: Secondary | ICD-10-CM | POA: Diagnosis not present

## 2021-11-28 DIAGNOSIS — H3581 Retinal edema: Secondary | ICD-10-CM | POA: Diagnosis not present

## 2021-12-07 ENCOUNTER — Encounter: Payer: Self-pay | Admitting: Cardiology

## 2021-12-10 DIAGNOSIS — H35342 Macular cyst, hole, or pseudohole, left eye: Secondary | ICD-10-CM | POA: Diagnosis not present

## 2021-12-17 DIAGNOSIS — H35342 Macular cyst, hole, or pseudohole, left eye: Secondary | ICD-10-CM | POA: Diagnosis not present

## 2021-12-17 DIAGNOSIS — H35372 Puckering of macula, left eye: Secondary | ICD-10-CM | POA: Diagnosis not present

## 2021-12-18 DIAGNOSIS — H35372 Puckering of macula, left eye: Secondary | ICD-10-CM | POA: Diagnosis not present

## 2021-12-18 DIAGNOSIS — H35342 Macular cyst, hole, or pseudohole, left eye: Secondary | ICD-10-CM | POA: Diagnosis not present

## 2021-12-22 ENCOUNTER — Other Ambulatory Visit: Payer: Self-pay

## 2021-12-22 DIAGNOSIS — I83893 Varicose veins of bilateral lower extremities with other complications: Secondary | ICD-10-CM

## 2021-12-25 DIAGNOSIS — H35342 Macular cyst, hole, or pseudohole, left eye: Secondary | ICD-10-CM | POA: Diagnosis not present

## 2021-12-25 DIAGNOSIS — H35372 Puckering of macula, left eye: Secondary | ICD-10-CM | POA: Diagnosis not present

## 2022-01-01 ENCOUNTER — Ambulatory Visit (HOSPITAL_COMMUNITY)
Admission: RE | Admit: 2022-01-01 | Discharge: 2022-01-01 | Disposition: A | Discharge status: Home / Self Care | Payer: Medicare Other | Source: Ambulatory Visit | Attending: Vascular Surgery | Admitting: Vascular Surgery

## 2022-01-01 ENCOUNTER — Ambulatory Visit: Payer: Medicare Other | Admitting: Physician Assistant

## 2022-01-01 VITALS — BP 150/69 | HR 54 | Temp 98.2°F | Resp 20 | Ht 67.0 in | Wt 173.1 lb

## 2022-01-01 DIAGNOSIS — I872 Venous insufficiency (chronic) (peripheral): Secondary | ICD-10-CM | POA: Diagnosis not present

## 2022-01-01 DIAGNOSIS — I83893 Varicose veins of bilateral lower extremities with other complications: Secondary | ICD-10-CM

## 2022-01-01 NOTE — Progress Notes (Signed)
Office Note     CC:  follow up Requesting Provider:  Denita Lung, MD  HPI: Jose Dyer is a 71 y.o. (08-24-1950) male who presents for evaluation of varicose veins of bilateral lower extremities.  Patient states he has noticed an increase in size and varicose veins of medial lower legs bilaterally over the past several months.  He has occasional pain in the area of varicosities however this comes and goes.  He is not bothered much by edema.  He is very active and walks about 5 to 6 miles a day with golf as well as treadmill use.  He denies any claudication, rest pain, or nonhealing wounds of bilateral lower extremities.  He also denies any history of DVT, venous ulcerations, trauma, or prior vascular intervention.  He does wear compression socks to the level of the knee which helps with symptoms of pain, fatigue, and aching in his lower legs at the end of the day.  He does not make an effort to elevate his legs during the day.  He denies tobacco use.   Past Medical History:  Diagnosis Date   Anginal pain ([place])    Corneal scarring    Coronary artery disease    Dyslipidemia    FHx: cardiovascular disease    GERD (gastroesophageal reflux disease)    History of hiatal hernia    History of kidney stones    "none since the 1990s" (01/27/2017)   Hypertension    Stroke [facility]) 2014   denies residual on 01/27/2017    Past Surgical History:  Procedure Laterality Date   CATARACT EXTRACTION Left    COLONOSCOPY  2004   CORONARY ANGIOPLASTY WITH STENT PLACEMENT  01/27/2017   CORONARY CTO INTERVENTION N/A 01/27/2017   Procedure: Coronary CTO Intervention;  Surgeon: Jettie Booze, MD;  Location: [place] [facility];  Service: Cardiovascular;  Laterality: N/A;   CORONARY STENT INTERVENTION  12/15/2016    Successful PTCA and stenting of the distal circumflex with 3.0 x 20 mm Synergy, stenosis reduced from 90% to 0% with maintenance of TIMI-3 to TIMI-3 flow.   CORONARY STENT INTERVENTION N/A  12/15/2016   Procedure: Coronary Stent Intervention;  Surgeon: Adrian Prows, MD;  Location: [place] [facility];  Service: Cardiovascular;  Laterality: N/A;  LAD   CORONARY STENT INTERVENTION N/A 01/27/2017   Procedure: Coronary Stent Intervention;  Surgeon: Jettie Booze, MD;  Location: [place] [facility];  Service: Cardiovascular;  Laterality: N/A;   EYE TRANSPLANT Right    LEFT HEART CATH AND CORONARY ANGIOGRAPHY N/A 12/15/2016   Procedure: Left Heart Cath and Coronary Angiography;  Surgeon: Adrian Prows, MD;  Location: [place] [facility];  Service: Cardiovascular;  Laterality: N/A;   LEFT HEART CATH AND CORONARY ANGIOGRAPHY N/A 01/27/2017   Procedure: Left Heart Cath and Coronary Angiography;  Surgeon: Jettie Booze, MD;  Location: [place] [facility];  Service: Cardiovascular;  Laterality: N/A;    Social History   Socioeconomic History   Marital status: Single    Spouse name: Not on file   Number of children: 2   Years of education: Not on file   Highest education level: Not on file  Occupational History   Not on file  Tobacco Use   Smoking status: Never    Passive exposure: Never   Smokeless tobacco: Never  Vaping Use   Vaping Use: Never used  Substance and Sexual Activity   Alcohol use: No   Drug use: No  Sexual activity: Yes  Other Topics Concern   Not on file  Social History Narrative   Not on file   Social Determinants of Health   Financial Resource Strain: Not on file  Food Insecurity: Not on file  Transportation Needs: Not on file  Physical Activity: Not on file  Stress: Not on file  Social Connections: Not on file  Intimate Partner Violence: Not on file    Family History  Problem Relation Age of Onset   Colon cancer Neg Hx    Esophageal cancer Neg Hx    Rectal cancer Neg Hx    Stomach cancer Neg Hx     Current Outpatient Medications  Medication Sig Dispense Refill   amLODipine (NORVASC) 5 MG tablet TAKE 1 TABLET BY MOUTH IN  THE EVENING AFTER  DINNER 90 tablet 3   aspirin 81 MG tablet Take 81 mg by mouth daily.     Evolocumab (REPATHA) 140 MG/ML SOSY Inject 1 pen into the skin every 30 (thirty) days. 6.3 mL 3   Multiple Vitamin (MULTI-VITAMIN) tablet Take 1 tablet by mouth daily.     nitroGLYCERIN (NITROSTAT) 0.4 MG SL tablet Place 1 tablet (0.4 mg total) under the tongue every 5 (five) minutes as needed for chest pain. 25 tablet 12   pantoprazole (PROTONIX) 40 MG tablet TAKE 1 TABLET BY MOUTH  DAILY 90 tablet 3   prednisoLONE acetate (PRED FORTE) 1 % ophthalmic suspension Place 1 drop into the right eye in the morning and at bedtime.     No current facility-administered medications for this visit.    Allergies  Allergen Reactions   Statins Other (See Comments)    Muscle tightening     REVIEW OF SYSTEMS:   '[X]'$  denotes positive finding, '[ ]'$  denotes negative finding Cardiac  Comments:  Chest pain or chest pressure:    Shortness of breath upon exertion:    Short of breath when lying flat:    Irregular heart rhythm:        Vascular    Pain in calf, thigh, or hip brought on by ambulation:    Pain in feet at night that wakes you up from your sleep:     Blood clot in your veins:    Leg swelling:         Pulmonary    Oxygen at home:    Productive cough:     Wheezing:         Neurologic    Sudden weakness in arms or legs:     Sudden numbness in arms or legs:     Sudden onset of difficulty speaking or slurred speech:    Temporary loss of vision in one eye:     Problems with dizziness:         Gastrointestinal    Blood in stool:     Vomited blood:         Genitourinary    Burning when urinating:     Blood in urine:        Psychiatric    Major depression:         Hematologic    Bleeding problems:    Problems with blood clotting too easily:        Skin    Rashes or ulcers:        Constitutional    Fever or chills:      PHYSICAL EXAMINATION:  Vitals:   01/01/22 1346  BP: (!) 150/69  Pulse: (!) 54  Resp: 20  Temp: 98.2 F (36.8 C)  TempSrc: Temporal  SpO2: 97%  Weight: 173 lb 1.6 oz (78.5 kg)  Height: '5\' 7"'$  (1.702 m)    General:  WDWN in NAD; vital signs documented above Gait: Not observed HENT: WNL, normocephalic Pulmonary: normal non-labored breathing , without Rales, rhonchi,  wheezing Cardiac: regular HR Abdomen: soft, NT, no masses Skin: without rashes Vascular Exam/Pulses:  Right Left  Radial 2+ (normal) 2+ (normal)  DP 2+ (normal) 2+ (normal)  PT 2+ (normal) 2+ (normal)   Extremities: without ischemic changes, without Gangrene , without cellulitis; without open wounds; large ropey varicosities of the medial lower legs bilaterally left more than the right; no venous ulcerations or pitting edema noted Musculoskeletal: no muscle wasting or atrophy  Neurologic: A&O X 3;  No focal weakness or paresthesias are detected Psychiatric:  The pt has Normal affect.   Non-Invasive Vascular Imaging:   Bilateral lower extremity venous reflux study negative for DVT  R Incompetent greater saphenous vein from the saphenofemoral junction down to the level of the knee with greater than 5 mm diameter Negative for deep venous reflux  L Incompetent greater saphenous vein from the saphenofemoral junction down to the level of the knee with greater than 5 mm diameter Incompetent common femoral vein    ASSESSMENT/PLAN:: 71 y.o. male with symptomatic varicose veins of bilateral lower extremities  -Venous reflux study of the bilateral lower extremities was negative for DVT.  He does have incompetent greater saphenous veins bilaterally that are large in diameter.  Patient was measured for thigh-high compression which she will wear regularly.  We also discussed proper leg elevation which should be done periodically throughout the day.  He will continue to be active.  He will also take NSAIDs for discomfort associated with his varicose veins.  He will follow-up with a surgeon in 3 months to  be evaluated for possible laser ablation therapy and stab phlebectomy.  He will call/return office sooner with any questions or concerns.   Dagoberto Ligas, PA-C [facility] [phone]  Clinic MD:   Scot Dock

## 2022-01-02 ENCOUNTER — Telehealth: Payer: Self-pay | Admitting: Cardiology

## 2022-01-02 DIAGNOSIS — R739 Hyperglycemia, unspecified: Secondary | ICD-10-CM

## 2022-01-02 DIAGNOSIS — E7800 Pure hypercholesterolemia, unspecified: Secondary | ICD-10-CM

## 2022-01-02 DIAGNOSIS — I25118 Atherosclerotic heart disease of native coronary artery with other forms of angina pectoris: Secondary | ICD-10-CM

## 2022-01-02 NOTE — Telephone Encounter (Signed)
Patient requesting lab orders before his next appointment. Please call him once this has been done.

## 2022-01-02 NOTE — Telephone Encounter (Signed)
What labs did you want ordered for this patient? Please advise.

## 2022-01-02 NOTE — Telephone Encounter (Signed)
ICD-10-CM   1. Atherosclerosis of native coronary artery of native heart with stable angina pectoris (HCC)  I25.118 CBC    CMP14+EGFR    2. Hypercholesteremia  E78.00 Lipid Panel With LDL/HDL Ratio    Lipoprotein A (LPA)    3. Hyperglycemia  R73.9 Hgb A1c w/o eAG     Orders Placed This Encounter  Procedures   CBC   CMP14+EGFR   Lipid Panel With LDL/HDL Ratio   Lipoprotein A (LPA)   Hgb A1c w/o eAG    Jay Ganji, MD, FACC 01/02/2022, 5:33 PM Office: [phone] Fax: [phone] Pager: [phone]  

## 2022-01-05 NOTE — Telephone Encounter (Signed)
Patient aware and has planned to have labs drawn Tuesday morning.

## 2022-01-07 ENCOUNTER — Ambulatory Visit: Payer: Medicare Other | Admitting: Cardiology

## 2022-01-07 ENCOUNTER — Encounter: Payer: Self-pay | Admitting: Cardiology

## 2022-01-07 VITALS — BP 135/75 | HR 61 | Temp 98.2°F | Resp 16 | Ht 67.0 in | Wt 171.0 lb

## 2022-01-07 DIAGNOSIS — T466X5A Adverse effect of antihyperlipidemic and antiarteriosclerotic drugs, initial encounter: Secondary | ICD-10-CM

## 2022-01-07 DIAGNOSIS — I351 Nonrheumatic aortic (valve) insufficiency: Secondary | ICD-10-CM

## 2022-01-07 DIAGNOSIS — I25118 Atherosclerotic heart disease of native coronary artery with other forms of angina pectoris: Secondary | ICD-10-CM

## 2022-01-07 DIAGNOSIS — R739 Hyperglycemia, unspecified: Secondary | ICD-10-CM | POA: Diagnosis not present

## 2022-01-07 DIAGNOSIS — R0989 Other specified symptoms and signs involving the circulatory and respiratory systems: Secondary | ICD-10-CM | POA: Diagnosis not present

## 2022-01-07 DIAGNOSIS — E7800 Pure hypercholesterolemia, unspecified: Secondary | ICD-10-CM

## 2022-01-07 DIAGNOSIS — I10 Essential (primary) hypertension: Secondary | ICD-10-CM | POA: Diagnosis not present

## 2022-01-07 DIAGNOSIS — Z125 Encounter for screening for malignant neoplasm of prostate: Secondary | ICD-10-CM

## 2022-01-07 DIAGNOSIS — G720 Drug-induced myopathy: Secondary | ICD-10-CM | POA: Diagnosis not present

## 2022-01-07 NOTE — Progress Notes (Signed)
Primary Physician/Referring:  Denita Lung, MD  Patient ID: Jose Dyer, male    DOB: Nov 27, 1950, 71 y.o.   MRN: [id number]  Chief Complaint  Patient presents with   Coronary Artery Disease   Hyperlipidemia   Follow-up    1 year   HPI:    Jose Dyer  is a 71 y.o. male patient with history of stroke without residual defectin 2015, CAD with CTO LAD S/P revascularization of the LAD on 01/27/2017 by retrograde access. He had undergone successful revascularization and stenting of the circumflex coronary artery on 12/14/2016 which is widely patent.   He continues to remain very active and walks anywhere from 8 to 10 miles a day.  He is asymptomatic.  Past Medical History:  Diagnosis Date   Anginal pain ([place])    Corneal scarring    Coronary artery disease    Dyslipidemia    FHx: cardiovascular disease    GERD (gastroesophageal reflux disease)    History of hiatal hernia    History of kidney stones    "none since the 1990s" (01/27/2017)   Hypertension    Stroke ([place]) 2014   denies residual on 01/27/2017   Social History   Tobacco Use   Smoking status: Never    Passive exposure: Never   Smokeless tobacco: Never  Substance Use Topics   Alcohol use: No   Marital Status: Single  ROS  Review of Systems  Cardiovascular:  Negative for chest pain, dyspnea on exertion and leg swelling.  Gastrointestinal:  Negative for melena.  Objective  Blood pressure 135/75, pulse 61, temperature 98.2 F (36.8 C), temperature source Temporal, resp. rate 16, height '5\' 7"'$  (1.702 m), weight 171 lb (77.6 kg), SpO2 96 %. Body mass index is 26.78 kg/m.     01/07/2022    3:28 PM 01/01/2022    1:46 PM 10/08/2021    3:37 PM  Vitals with BMI  Height '5\' 7"'$  '5\' 7"'$    Weight 171 lbs 173 lbs 2 oz   BMI 71.69 67.89   Systolic 381 017 510  Diastolic 75 69 76  Pulse 61 54 60     Physical Exam Neck:     Vascular: No carotid bruit or JVD.  Cardiovascular:     Rate and Rhythm: Normal rate and regular  rhythm.     Pulses: Intact distal pulses.     Heart sounds: Murmur heard.  Midsystolic murmur is present with a grade of 2/6 at the upper right sternal border radiating to the apex.    No gallop.  Pulmonary:     Effort: Pulmonary effort is normal.     Breath sounds: Normal breath sounds.  Abdominal:     General: Bowel sounds are normal.     Palpations: Abdomen is soft.  Musculoskeletal:     Right lower leg: No edema.     Left lower leg: No edema.    Laboratory examination:   Recent Labs    01/07/22 0918  NA 143  K 4.4  CL 105  CO2 25  GLUCOSE 102*  BUN 22  CREATININE 0.89  CALCIUM 9.1    estimated creatinine clearance is 69.1 mL/min (by C-G formula based on SCr of 0.89 mg/dL).     Latest Ref Rng & Units 01/07/2022    9:18 AM 12/30/2020    1:12 PM 09/04/2018    3:55 AM  CMP  Glucose 70 - 99 mg/dL 102   94   114    BUN 8 -  27 mg/dL '22   23   25    '$ Creatinine 0.76 - 1.27 mg/dL 0.89   0.93   0.96    Sodium 134 - 144 mmol/L 143   142   139    Potassium 3.5 - 5.2 mmol/L 4.4   4.8   4.0    Chloride 96 - 106 mmol/L 105   103   103    CO2 20 - 29 mmol/L '25   24   24    '$ Calcium 8.6 - 10.2 mg/dL 9.1   9.7   9.5    Total Protein 6.0 - 8.5 g/dL 6.9   7.3   6.6    Total Bilirubin 0.0 - 1.2 mg/dL 0.4   0.5   0.6    Alkaline Phos 44 - 121 IU/L 74   73   53    AST 0 - 40 IU/L '19   19   21    '$ ALT 0 - 44 IU/L '15   16   22        '$ Latest Ref Rng & Units 01/07/2022    9:18 AM 12/30/2020    1:14 PM 09/04/2018    3:55 AM  CBC  WBC 3.4 - 10.8 x10E3/uL 8.3   6.6   11.1    Hemoglobin 13.0 - 17.7 g/dL 14.2   13.9   14.8    Hematocrit 37.5 - 51.0 % 41.7   42.0   44.7    Platelets 150 - 450 x10E3/uL 208   201   244     Lipid Panel Recent Labs    01/07/22 0918  CHOL 138  TRIG 67  LDLCALC 79  HDL 45    HEMOGLOBIN A1C Lab Results  Component Value Date   HGBA1C 5.4 01/07/2022   MPG 111 01/30/2014   TSH No results for input(s): TSH in the last 8760 hours.  Medications and  allergies   Allergies  Allergen Reactions   Statins Other (See Comments)    Muscle tightening     Current Outpatient Medications:    amLODipine (NORVASC) 5 MG tablet, TAKE 1 TABLET BY MOUTH IN  THE EVENING AFTER DINNER, Disp: 90 tablet, Rfl: 3   aspirin 81 MG tablet, Take 81 mg by mouth daily., Disp: , Rfl:    Evolocumab (REPATHA) 140 MG/ML SOSY, Inject 1 pen into the skin every 30 (thirty) days., Disp: 6.3 mL, Rfl: 3   ezetimibe (ZETIA) 10 MG tablet, Take 1 tablet (10 mg total) by mouth daily., Disp: 90 tablet, Rfl: 3   Multiple Vitamin (MULTI-VITAMIN) tablet, Take 1 tablet by mouth daily., Disp: , Rfl:    nitroGLYCERIN (NITROSTAT) 0.4 MG SL tablet, Place 1 tablet (0.4 mg total) under the tongue every 5 (five) minutes as needed for chest pain., Disp: 25 tablet, Rfl: 12   pantoprazole (PROTONIX) 40 MG tablet, TAKE 1 TABLET BY MOUTH  DAILY, Disp: 90 tablet, Rfl: 3   prednisoLONE acetate (PRED FORTE) 1 % ophthalmic suspension, Place 1 drop into the right eye in the morning and at bedtime., Disp: , Rfl:     Radiology:   No results found.  Cardiac Studies:   Coronary angiogram by Dr. Irish Lack 01/27/2017: CTO Prox LAD to Mid LAD lesion, 100 %stenosed, right to left collaterals.Successful CTO PCI with a SYNERGY DES [id number] drug eluting stent which was successfully placed, postdilated to > 4 mm. Distal Cx 3.0x20 mm Synergy DES placed 12/14/2016 widely patent. Normal LVEF.  Echocardiogram 09/23/2016: Left ventricle  cavity is normal in size. Moderate concentric hypertrophy of the left ventricle. Normal global wall motion. Doppler evidence of grade II (pseudonormal) diastolic dysfunction. Diastolic dysfunction findings suggests elevated LA/LV end diastolic pressure. Calculated EF 66%. Left atrial cavity is severely dilated at 5.1 cm. Aneurysmal interatrial septum without PFO. Right atrial cavity is moderately dilated. Trileaflet aortic valve with mild to moderate regurgitation. Miotral valve  apperas normal. There is mild to moderate regurgitation. Mild to moderate tricuspid regurgitation. Mild to moderate pulmonary hypertension. Pulmonary artery systolic pressure is estimated at 40 mm Hg. IVC is dilated with respiratory variation. Suggests mild elevation in right hear  Carotid duplex 01/24/2014: Minimal amount of bilateral intimal wall thickening right greater than the left.  Venous insufficiency study 01/01/2022: Bilateral lower extremity venous reflux is noted.  EKG:   EKG 01/07/2022: Normal sinus rhythm at the rate of 56 bpm, leftward enlargement, poor R progression, probably normal variant.  No evidence of ischemia.  Normal QT interval.  Change from 01/01/2021.   Assessment     ICD-10-CM   1. Atherosclerosis of native coronary artery of native heart with stable angina pectoris ([place])  I25.118 EKG 12-Lead    PCV ECHOCARDIOGRAM COMPLETE    2. Moderate aortic regurgitation  I35.1 PCV ECHOCARDIOGRAM COMPLETE    3. Left carotid bruit  R09.89 PCV CAROTID DUPLEX (BILATERAL)    4. Hypercholesteremia  E78.00 ezetimibe (ZETIA) 10 MG tablet    5. Statin myopathy  G72.0    T46.6X5A     6. Primary hypertension  I10     7. Screening for prostate cancer  Z12.5 PSA      There are no discontinued medications.   Meds ordered this encounter  Medications   ezetimibe (ZETIA) 10 MG tablet    Sig: Take 1 tablet (10 mg total) by mouth daily.    Dispense:  90 tablet    Refill:  3   Orders Placed This Encounter  Procedures   PSA    Order Specific Question:   CC Results    Answer:   Denita Lung [[id number]]   EKG 12-Lead   PCV ECHOCARDIOGRAM COMPLETE    Standing Status:   Future    Standing Expiration Date:   01/08/2023   Recommendations:   Jose Dyer is a 71 y.o. male patient with history of stroke without residual defects 2015, CAD with CTO LAD S/P revascularization of the LAD on 01/27/2017 by retrograde access. He had undergone successful revascularization and stenting of the  circumflex coronary artery on 12/14/2016 which is widely patent.  He was previously on Repatha and had significant reduction in LDL with this. He states that he had severe activity limiting back pain and also myalgias.  Hence he has been taking Repatha only once a month.  Underwent labs presents for follow-up.  Patient remains completely asymptomatic and is walking at least 8 to 10 miles a day.  Physical examination is unremarkable except for systolic murmur at the apex and right sternal border, as it has been >4 to 5 years since echocardiogram, will repeat echocardiogram for follow-up of aortic regurgitation and mitral regurgitation along with LVEF.  No change in his EKG.  His left carotid bruit appears to be much more prominent.  I will obtain carotid duplex as he also has had history of stroke in 2015.  Blood pressure is well controlled.  I will see him back in a year or sooner if problems.   Addendum: Labs reviewed, renal function normal, liver function normal,  mild hyperglycemia noted without diabetes mellitus, lipids markedly improved however LDL not at goal <70, addition of Zetia will certainly bring this to goal especially in view of prior stroke and history of coronary artery disease.    Jose Prows, MD, [facility] 01/09/2022, 6:24 AM Office: [phone]

## 2022-01-08 LAB — CMP14+EGFR
ALT: 15 IU/L (ref 0–44)
AST: 19 IU/L (ref 0–40)
Albumin/Globulin Ratio: 1.9 (ref 1.2–2.2)
Albumin: 4.5 g/dL (ref 3.7–4.7)
Alkaline Phosphatase: 74 IU/L (ref 44–121)
BUN/Creatinine Ratio: 25 — ABNORMAL HIGH (ref 10–24)
BUN: 22 mg/dL (ref 8–27)
Bilirubin Total: 0.4 mg/dL (ref 0.0–1.2)
CO2: 25 mmol/L (ref 20–29)
Calcium: 9.1 mg/dL (ref 8.6–10.2)
Chloride: 105 mmol/L (ref 96–106)
Creatinine, Ser: 0.89 mg/dL (ref 0.76–1.27)
Globulin, Total: 2.4 g/dL (ref 1.5–4.5)
Glucose: 102 mg/dL — ABNORMAL HIGH (ref 70–99)
Potassium: 4.4 mmol/L (ref 3.5–5.2)
Sodium: 143 mmol/L (ref 134–144)
Total Protein: 6.9 g/dL (ref 6.0–8.5)
eGFR: 90 mL/min/{1.73_m2} (ref >59)

## 2022-01-08 LAB — LIPID PANEL WITH LDL/HDL RATIO
Cholesterol, Total: 138 mg/dL (ref 100–199)
HDL: 45 mg/dL (ref >39)
LDL Chol Calc (NIH): 79 mg/dL (ref 0–99)
LDL/HDL Ratio: 1.8 ratio (ref 0.0–3.6)
Triglycerides: 67 mg/dL (ref 0–149)
VLDL Cholesterol Cal: 14 mg/dL (ref 5–40)

## 2022-01-08 LAB — CBC
Hematocrit: 41.7 % (ref 37.5–51.0)
Hemoglobin: 14.2 g/dL (ref 13.0–17.7)
MCH: 30.3 pg (ref 26.6–33.0)
MCHC: 34.1 g/dL (ref 31.5–35.7)
MCV: 89 fL (ref 79–97)
Platelets: 208 10*3/uL (ref 150–450)
RBC: 4.68 x10E6/uL (ref 4.14–5.80)
RDW: 12.6 % (ref 11.6–15.4)
WBC: 8.3 10*3/uL (ref 3.4–10.8)

## 2022-01-08 LAB — HGB A1C W/O EAG: Hgb A1c MFr Bld: 5.4 % (ref 4.8–5.6)

## 2022-01-08 LAB — LIPOPROTEIN A (LPA): Lipoprotein (a): 63 nmol/L (ref <75.0)

## 2022-01-09 MED ORDER — EZETIMIBE 10 MG PO TABS: 10.0000 mg | ORAL_TABLET | Freq: Every day | ORAL | 3 refills | Status: DC

## 2022-01-09 NOTE — Progress Notes (Signed)
FYI, I will add Zetia 10 mg daily

## 2022-01-15 ENCOUNTER — Ambulatory Visit: Payer: Medicare Other

## 2022-01-15 DIAGNOSIS — I351 Nonrheumatic aortic (valve) insufficiency: Secondary | ICD-10-CM

## 2022-01-15 DIAGNOSIS — I25118 Atherosclerotic heart disease of native coronary artery with other forms of angina pectoris: Secondary | ICD-10-CM | POA: Diagnosis not present

## 2022-01-15 DIAGNOSIS — R0989 Other specified symptoms and signs involving the circulatory and respiratory systems: Secondary | ICD-10-CM

## 2022-01-22 DIAGNOSIS — H43811 Vitreous degeneration, right eye: Secondary | ICD-10-CM | POA: Diagnosis not present

## 2022-01-22 DIAGNOSIS — H35342 Macular cyst, hole, or pseudohole, left eye: Secondary | ICD-10-CM | POA: Diagnosis not present

## 2022-01-28 DIAGNOSIS — Z961 Presence of intraocular lens: Secondary | ICD-10-CM | POA: Diagnosis not present

## 2022-01-28 DIAGNOSIS — Z947 Corneal transplant status: Secondary | ICD-10-CM | POA: Diagnosis not present

## 2022-01-28 DIAGNOSIS — H43812 Vitreous degeneration, left eye: Secondary | ICD-10-CM | POA: Diagnosis not present

## 2022-03-26 DIAGNOSIS — H43811 Vitreous degeneration, right eye: Secondary | ICD-10-CM | POA: Diagnosis not present

## 2022-03-26 DIAGNOSIS — H35033 Hypertensive retinopathy, bilateral: Secondary | ICD-10-CM | POA: Diagnosis not present

## 2022-04-01 ENCOUNTER — Ambulatory Visit: Payer: Medicare Other | Admitting: Vascular Surgery

## 2022-04-01 ENCOUNTER — Encounter: Payer: Self-pay | Admitting: Vascular Surgery

## 2022-04-01 VITALS — BP 158/69 | HR 56 | Temp 98.7°F | Ht 67.0 in | Wt 168.0 lb

## 2022-04-01 DIAGNOSIS — I872 Venous insufficiency (chronic) (peripheral): Secondary | ICD-10-CM

## 2022-04-01 NOTE — Progress Notes (Signed)
Patient ID: Jose Dyer, male   DOB: 09/09/1950, 71 y.o.   MRN: [id number]  Reason for Consult: No chief complaint on file.   Referred by Denita Lung, MD  Subjective:     HPI:  Jose Dyer is a 71 y.o. male with a history of bilateral lower extremity large refluxing varicose veins that cause discomfort he has had some improvement with wearing compression socks.  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  Does not take any blood there is denies any history of DVT does not have any tissue loss or ulceration.  Past Medical History:  Diagnosis Date   Anginal pain ([place])    Corneal scarring    Coronary artery disease    Dyslipidemia    FHx: cardiovascular disease    GERD (gastroesophageal reflux disease)    History of hiatal hernia    History of kidney stones    "none since the 1990s" (01/27/2017)   Hypertension    Stroke [facility]) 2014   denies residual on 01/27/2017   Family History  Problem Relation Age of Onset   Other Mother        Natural Causes   Other Sister        Natural Causes   Colon cancer Neg Hx    Esophageal cancer Neg Hx    Rectal cancer Neg Hx    Stomach cancer Neg Hx    Past Surgical History:  Procedure Laterality Date   CATARACT EXTRACTION Left    COLONOSCOPY  2004   CORONARY ANGIOPLASTY WITH STENT PLACEMENT  01/27/2017   CORONARY CTO INTERVENTION N/A 01/27/2017   Procedure: Coronary CTO Intervention;  Surgeon: Jettie Booze, MD;  Location: [facility];  Service: Cardiovascular;  Laterality: N/A;   CORONARY STENT INTERVENTION  12/15/2016    Successful PTCA and stenting of the distal circumflex with 3.0 x 20 mm Synergy, stenosis reduced from 90% to 0% with maintenance of TIMI-3 to TIMI-3 flow.   CORONARY STENT INTERVENTION N/A 12/15/2016   Procedure: Coronary Stent Intervention;  Surgeon: Adrian Prows, MD;  Location: [facility];  Service: Cardiovascular;  Laterality: N/A;  LAD    CORONARY STENT INTERVENTION N/A 01/27/2017   Procedure: Coronary Stent Intervention;  Surgeon: Jettie Booze, MD;  Location: [facility];  Service: Cardiovascular;  Laterality: N/A;   EYE TRANSPLANT Right    LEFT HEART CATH AND CORONARY ANGIOGRAPHY N/A 12/15/2016   Procedure: Left Heart Cath and Coronary Angiography;  Surgeon: Adrian Prows, MD;  Location: [facility];  Service: Cardiovascular;  Laterality: N/A;   LEFT HEART CATH AND CORONARY ANGIOGRAPHY N/A 01/27/2017   Procedure: Left Heart Cath and Coronary Angiography;  Surgeon: Jettie Booze, MD;  Location: [facility];  Service: Cardiovascular;  Laterality: N/A;    Short Social History:  Social History   Tobacco Use   Smoking status: Never    Passive exposure: Never   Smokeless tobacco: Never  Substance Use Topics   Alcohol use: No    Allergies  Allergen Reactions   Statins Other (See Comments)    Muscle tightening    Current Outpatient Medications  Medication Sig Dispense Refill   amLODipine (NORVASC) 5 MG tablet TAKE 1 TABLET BY MOUTH IN  THE EVENING AFTER DINNER 90 tablet 3   aspirin 81 MG tablet Take 81 mg by mouth daily.     Evolocumab (REPATHA) 140  MG/ML SOSY Inject 1 pen into the skin every 30 (thirty) days. 6.3 mL 3   ezetimibe (ZETIA) 10 MG tablet Take 1 tablet (10 mg total) by mouth daily. 90 tablet 3   Multiple Vitamin (MULTI-VITAMIN) tablet Take 1 tablet by mouth daily.     nitroGLYCERIN (NITROSTAT) 0.4 MG SL tablet Place 1 tablet (0.4 mg total) under the tongue every 5 (five) minutes as needed for chest pain. 25 tablet 12   pantoprazole (PROTONIX) 40 MG tablet TAKE 1 TABLET BY MOUTH  DAILY 90 tablet 3   prednisoLONE acetate (PRED FORTE) 1 % ophthalmic suspension Place 1 drop into the right eye in the morning and at bedtime.     No current facility-administered medications for this visit.    Review of Systems  Constitutional:  Constitutional negative. HENT: HENT negative.  Eyes:  Eyes negative.  Respiratory: Respiratory negative.  Cardiovascular: Positive for leg swelling.  GI: Gastrointestinal negative.  Musculoskeletal: Positive for myalgias.  Skin: Skin negative.  Neurological: Positive for numbness.  Hematologic: Hematologic/lymphatic negative.  Psychiatric: Psychiatric negative.        Objective:  Objective   Vitals:   04/01/22 1511  BP: (!) 158/69  Pulse: (!) 56  Temp: 98.7 F (37.1 C)  SpO2: 97%     Physical Exam HENT:     Head: Normocephalic.     Nose: Nose normal.  Eyes:     Pupils: Pupils are equal, round, and reactive to light.  Cardiovascular:     Rate and Rhythm: Normal rate.     Pulses: Normal pulses.  Pulmonary:     Effort: Pulmonary effort is normal.  Abdominal:     General: Abdomen is flat.  Musculoskeletal:        General: Normal range of motion.     Cervical back: Normal range of motion.     Right lower leg: Edema present.     Left lower leg: Edema present.  Skin:    General: Skin is warm and dry.     Capillary Refill: Capillary refill takes less than 2 seconds.  Neurological:     General: No focal deficit present.     Mental Status: He is alert.  Psychiatric:        Mood and Affect: Mood normal.        Data: Venous Reflux Times  +--------------+---------+------+-----------+------------+--------+  RIGHT         Reflux NoRefluxReflux TimeDiameter cmsComments                          Yes                                   +--------------+---------+------+-----------+------------+--------+  CFV           no                                              +--------------+---------+------+-----------+------------+--------+  FV prox       no                                              +--------------+---------+------+-----------+------------+--------+  FV mid  no                                              +--------------+---------+------+-----------+------------+--------+   Popliteal     no                                              +--------------+---------+------+-----------+------------+--------+  GSV at [facility]              yes    >500 ms     0.930              +--------------+---------+------+-----------+------------+--------+  GSV prox thigh          yes    >500 ms     0.661              +--------------+---------+------+-----------+------------+--------+  GSV mid thigh           yes    >500 ms     0.631              +--------------+---------+------+-----------+------------+--------+  GSV dist thigh          yes    >500 ms     0.559              +--------------+---------+------+-----------+------------+--------+  GSV at knee             yes    >500 ms     0.547              +--------------+---------+------+-----------+------------+--------+  GSV prox calf                              0.536              +--------------+---------+------+-----------+------------+--------+  GSV mid calf                               0.454              +--------------+---------+------+-----------+------------+--------+  SSV Pop Fossa no                           0.204              +--------------+---------+------+-----------+------------+--------+  SSV prox calf no                           0.212              +--------------+---------+------+-----------+------------+--------+  SSV mid calf                               0.269              +--------------+---------+------+-----------+------------+--------+      +--------------+---------+------+-----------+------------+--------+  LEFT          Reflux NoRefluxReflux TimeDiameter cmsComments                          Yes                                   +--------------+---------+------+-----------+------------+--------+  CFV                     yes   >1 second                        +--------------+---------+------+-----------+------------+--------+  FV prox       no                                              +--------------+---------+------+-----------+------------+--------+  FV mid        no                                              +--------------+---------+------+-----------+------------+--------+  FV dist       no                                              +--------------+---------+------+-----------+------------+--------+  Popliteal     no                                              +--------------+---------+------+-----------+------------+--------+  GSV at SFJ              yes    >500 ms      1.19              +--------------+---------+------+-----------+------------+--------+  GSV prox thigh          yes    >500 ms     0.828              +--------------+---------+------+-----------+------------+--------+  GSV mid thigh           yes    >500 ms      1.26              +--------------+---------+------+-----------+------------+--------+  GSV dist thigh          yes    >500 ms     0.854              +--------------+---------+------+-----------+------------+--------+  GSV at knee             yes    >500 ms     0.821              +--------------+---------+------+-----------+------------+--------+  GSV prox calf                              0.820              +--------------+---------+------+-----------+------------+--------+  GSV mid calf                               0.613              +--------------+---------+------+-----------+------------+--------+  SSV Pop Fossa no  0.212              +--------------+---------+------+-----------+------------+--------+  SSV prox calf no                           0.217              +--------------+---------+------+-----------+------------+--------+  SSV mid calf                               0.218               +--------------+---------+------+-----------+------------+--------+           Summary:  Right:  - No evidence of deep vein thrombosis seen in the right lower extremity,  from the common femoral through the popliteal veins.  - No evidence of superficial venous thrombosis in the right lower  extremity.  - No evidence of superficial venous reflux seen in the right short  saphenous vein.  - Venous reflux is noted in the right sapheno-femoral junction.  - Venous reflux is noted in the right greater saphenous vein in the thigh.  - Venous reflux is noted in the right greater saphenous vein in the calf.     Left:  - No evidence of deep vein thrombosis seen in the left lower extremity,  from the common femoral through the popliteal veins.  - No evidence of superficial venous thrombosis in the left lower  extremity.  - No evidence of superficial venous reflux seen in the left short  saphenous vein.  - Venous reflux is noted in the left sapheno-femoral junction.  - Venous reflux is noted in the left greater saphenous vein in the thigh.  - Venous reflux is noted in the left greater saphenous vein in the calf.      Assessment/Plan:     71 year old male with history of bilateral C3 venous disease with large refluxing greater saphenous veins on the left is very large and is within the fascia throughout the thigh and has very large varicosities on the medial leg.  He has been compliant with compression stockings and we will plan for left greater saphenous vein ablation with greater than 20 stab phlebectomies.  He will need subsequent consideration of right greater saphenous vein ablation and likely could be 10-20 stab phlebectomies on that side.  The right side greater saphenous vein is quite superficial to the mid thigh we could consider ablation from the mid thigh more cephalad and then stab phlebectomy of the great saphenous vein more distally.  I discussed the risk benefits  alternatives with the patient as well as the need for continued compression therapy he demonstrates good understanding.     Waynetta Sandy MD [facility]

## 2022-04-09 ENCOUNTER — Other Ambulatory Visit: Payer: Self-pay | Admitting: *Deleted

## 2022-04-09 DIAGNOSIS — H52211 Irregular astigmatism, right eye: Secondary | ICD-10-CM | POA: Diagnosis not present

## 2022-04-09 DIAGNOSIS — H538 Other visual disturbances: Secondary | ICD-10-CM | POA: Diagnosis not present

## 2022-04-09 DIAGNOSIS — Z79899 Other long term (current) drug therapy: Secondary | ICD-10-CM | POA: Diagnosis not present

## 2022-04-09 DIAGNOSIS — I83891 Varicose veins of right lower extremities with other complications: Secondary | ICD-10-CM

## 2022-04-09 DIAGNOSIS — Z961 Presence of intraocular lens: Secondary | ICD-10-CM | POA: Diagnosis not present

## 2022-04-09 DIAGNOSIS — I83892 Varicose veins of left lower extremities with other complications: Secondary | ICD-10-CM

## 2022-04-09 DIAGNOSIS — Z947 Corneal transplant status: Secondary | ICD-10-CM | POA: Diagnosis not present

## 2022-04-22 ENCOUNTER — Encounter: Payer: Self-pay | Admitting: Internal Medicine

## 2022-05-05 ENCOUNTER — Telehealth: Payer: Self-pay | Admitting: Family Medicine

## 2022-05-05 DIAGNOSIS — R972 Elevated prostate specific antigen [PSA]: Secondary | ICD-10-CM

## 2022-05-05 DIAGNOSIS — I10 Essential (primary) hypertension: Secondary | ICD-10-CM

## 2022-05-05 DIAGNOSIS — I6389 Other cerebral infarction: Secondary | ICD-10-CM

## 2022-05-05 DIAGNOSIS — E785 Hyperlipidemia, unspecified: Secondary | ICD-10-CM

## 2022-05-05 DIAGNOSIS — I25118 Atherosclerotic heart disease of native coronary artery with other forms of angina pectoris: Secondary | ICD-10-CM

## 2022-05-05 NOTE — Telephone Encounter (Signed)
I spoke with patient to schedule his AWV w/[facility] prior to Dr Lanice Shirts appt on 05/18/22.  Patient schedule AWV 05/15/22 and wanted to know if he could have labs done this day, so when he comes in to see provider they could discuss results.  Please advise

## 2022-05-06 ENCOUNTER — Other Ambulatory Visit: Payer: Self-pay

## 2022-05-06 DIAGNOSIS — E7800 Pure hypercholesterolemia, unspecified: Secondary | ICD-10-CM

## 2022-05-06 MED ORDER — EZETIMIBE 10 MG PO TABS: 10.0000 mg | ORAL_TABLET | Freq: Every day | ORAL | 3 refills | Status: DC

## 2022-05-08 ENCOUNTER — Telehealth: Payer: Self-pay | Admitting: Licensed Clinical Social Worker

## 2022-05-08 NOTE — Patient Outreach (Signed)
  Care Coordination   Initial Visit Note   05/08/2022 Name: Piers Baade MRN: [id number] DOB: 25-Aug-1950  Edwards Mckelvie is a 71 y.o. year old male who sees Denita Lung, MD for primary care. I spoke with  [facility] by phone today.  What matters to the patients health and wellness today?  Care Coordination    Goals Addressed             This Visit's Progress    Care Coordination Activities-No Follow Up       Care Coordination Interventions: Active listening / Reflection utilized  LCSW informed patient of care coordination services. Pt is not interested at this time and agreed to contact PCP, should needs arise LCSW reminded him of available vaccines. Pt will address with PCP at upcoming appt scheduled for 10/02 Pt requested an earlier appt for AWV scheduled for 05/15/22, stating he would like to do fasting labs to discuss with PCP. LCSW will contact Front Desk and inform them of request        SDOH assessments and interventions completed:  No     Care Coordination Interventions Activated:  Yes  Care Coordination Interventions:  Yes, provided   Follow up plan: No further intervention required.   Encounter Outcome:  Pt. Refused   Christa See, MSW, [email or web address] Phone [phone] 12:49 PM

## 2022-05-08 NOTE — Patient Instructions (Signed)
Visit Information  Thank you for taking time to visit with me today. Please don't hesitate to contact me if I can be of assistance to you.   Following are the goals we discussed today:   Goals Addressed             This Visit's Progress    Care Coordination Activities-No Follow Up       Care Coordination Interventions: Active listening / Reflection utilized  LCSW informed patient of care coordination services. Pt is not interested at this time and agreed to contact PCP, should needs arise LCSW reminded him of available vaccines. Pt will address with PCP at upcoming appt scheduled for 10/02 Pt requested an earlier appt for AWV scheduled for 05/15/22, stating he would like to do fasting labs to discuss with PCP. LCSW will contact [place] and inform them of request        If you are experiencing a Mental Health or [place] or need someone to talk to, please call the Suicide and Crisis Lifeline: 988 call 911   Patient verbalizes understanding of instructions and care plan provided today and agrees to view in [place]. Active MyChart status and patient understanding of how to access instructions and care plan via MyChart confirmed with patient.     No further follow up required:    Christa See, MSW, [email or web address] Phone [phone] 12:49 PM

## 2022-05-12 ENCOUNTER — Other Ambulatory Visit: Payer: Self-pay | Admitting: *Deleted

## 2022-05-12 MED ORDER — LORAZEPAM 1 MG PO TABS: ORAL_TABLET | ORAL | 0 refills | Status: DC

## 2022-05-13 ENCOUNTER — Other Ambulatory Visit: Payer: Self-pay | Admitting: Cardiology

## 2022-05-14 ENCOUNTER — Encounter: Payer: Medicare Other | Admitting: Family Medicine

## 2022-05-15 ENCOUNTER — Ambulatory Visit (INDEPENDENT_AMBULATORY_CARE_PROVIDER_SITE_OTHER): Payer: Medicare Other

## 2022-05-15 VITALS — BP 140/70 | HR 58 | Temp 97.7°F | Ht 68.0 in | Wt 169.4 lb

## 2022-05-15 DIAGNOSIS — Z0000 Encounter for general adult medical examination without abnormal findings: Secondary | ICD-10-CM

## 2022-05-15 NOTE — Progress Notes (Signed)
Subjective:   Jose Dyer is a 71 y.o. male who presents for Medicare Annual/Subsequent preventive examination.  Review of Systems     Cardiac Risk Factors include: advanced age (>3mn, >>55women);dyslipidemia;hypertension     Objective:    Today's Vitals   05/15/22 1421  BP: (!) 140/70  Pulse: (!) 58  Temp: 97.7 F (36.5 C)  TempSrc: Oral  SpO2: 98%  Weight: 169 lb 6.4 oz (76.8 kg)  Height: '5\' 8"'$  (1.727 m)   Body mass index is 25.76 kg/m.     05/15/2022    2:30 PM 05/13/2021    2:02 PM 09/04/2018    2:31 AM 01/27/2017    2:36 PM 01/27/2017    7:36 AM 12/15/2016    3:38 PM 12/15/2016    9:28 AM  Advanced Directives  Does Patient Have a Medical Advance Directive? Yes Yes No Yes  Yes Yes  Type of AParamed[place]of [place] will [place]North [place]Living will;Out of facility DNR (pink MOST or yellow form)  [place]Living will [place] RunLiving will Living will;Healthcare Power of [place] will  Does patient want to make changes to medical advance directive?    No - Patient declined  No - Patient declined   Copy of [place] Chart? No - copy requested No - copy requested  No - copy requested No - copy requested No - copy requested   Would patient like information on creating a medical advance directive?   No - Patient declined        Current Medications (verified) Outpatient Encounter Medications as of 05/15/2022  Medication Sig   amLODipine (NORVASC) 5 MG tablet TAKE 1 TABLET BY MOUTH IN  THE EVENING AFTER DINNER   aspirin 81 MG tablet Take 81 mg by mouth daily.   Evolocumab (REPATHA) 140 MG/ML SOSY Inject 1 pen into the skin every 30 (thirty) days.   ezetimibe (ZETIA) 10 MG tablet Take 1 tablet (10 mg total) by mouth daily.   Multiple Vitamin (MULTI-VITAMIN) tablet Take 1 tablet by mouth daily.   nitroGLYCERIN (NITROSTAT) 0.4 MG SL tablet  Place 1 tablet (0.4 mg total) under the tongue every 5 (five) minutes as needed for chest pain.   pantoprazole (PROTONIX) 40 MG tablet TAKE 1 TABLET BY MOUTH  DAILY   prednisoLONE acetate (PRED FORTE) 1 % ophthalmic suspension Place 1 drop into the right eye in the morning and at bedtime.   REPATHA SURECLICK 1093MG/ML SOAJ INJECT 1 PEN (1ML) INTO THE SKIN EVERY 14 DAYS   LORazepam (ATIVAN) 1 MG tablet Take 1 tablet 30 minutes prior to leaving your house on day of office surgery.  Bring second tablet with you to office on day of office surgery. (Patient not taking: Reported on 05/15/2022)   No facility-administered encounter medications on file as of 05/15/2022.    Allergies (verified) Statins   History: Past Medical History:  Diagnosis Date   [place]inal pain ([place]    Corneal scarring    Coronary artery disease    Dyslipidemia    FHx: cardiovascular disease    GERD (gastroesophageal reflux disease)    History of hiatal hernia    History of kidney stones    "none since the 1990s" (01/27/2017)   Hypertension    Stroke ([facility] 2014   denies residual on 01/27/2017   Past Surgical History:  Procedure Laterality Date   CATARACT EXTRACTION Left  COLONOSCOPY  2004   CORONARY ANGIOPLASTY WITH STENT PLACEMENT  01/27/2017   CORONARY CTO INTERVENTION N/A 01/27/2017   Procedure: Coronary CTO Intervention;  Surgeon: Jettie Booze, MD;  Location: [facility];  Service: Cardiovascular;  Laterality: N/A;   CORONARY STENT INTERVENTION  12/15/2016    Successful PTCA and stenting of the distal circumflex with 3.0 x 20 mm Synergy, stenosis reduced from 90% to 0% with maintenance of TIMI-3 to TIMI-3 flow.   CORONARY STENT INTERVENTION N/A 12/15/2016   Procedure: Coronary Stent Intervention;  Surgeon: Adrian Prows, MD;  Location: [facility];  Service: Cardiovascular;  Laterality: N/A;  LAD   CORONARY STENT INTERVENTION N/A 01/27/2017   Procedure: Coronary Stent Intervention;  Surgeon:  Jettie Booze, MD;  Location: [facility];  Service: Cardiovascular;  Laterality: N/A;   EYE TRANSPLANT Right    LEFT HEART CATH AND CORONARY ANGIOGRAPHY N/A 12/15/2016   Procedure: Left Heart Cath and Coronary Angiography;  Surgeon: Adrian Prows, MD;  Location: [facility];  Service: Cardiovascular;  Laterality: N/A;   LEFT HEART CATH AND CORONARY ANGIOGRAPHY N/A 01/27/2017   Procedure: Left Heart Cath and Coronary Angiography;  Surgeon: Jettie Booze, MD;  Location: [facility];  Service: Cardiovascular;  Laterality: N/A;   Family History  Problem Relation Age of Onset   Other Mother        Natural Causes   Other Sister        Natural Causes   Colon cancer Neg Hx    Esophageal cancer Neg Hx    Rectal cancer Neg Hx    Stomach cancer Neg Hx    Social History   Socioeconomic History   Marital status: Single    Spouse name: Not on file   Number of children: 2   Years of education: Not on file   Highest education level: Not on file  Occupational History   Not on file  Tobacco Use   Smoking status: Never    Passive exposure: Never   Smokeless tobacco: Never  Vaping Use   Vaping Use: Never used  Substance and Sexual Activity   Alcohol use: No   Drug use: No   Sexual activity: Yes  Other Topics Concern   Not on file  Social History Narrative   Not on file   Social Determinants of Health   Financial Resource Strain: Low Risk  (05/15/2022)   Overall Financial Resource Strain (CARDIA)    Difficulty of Paying Living Expenses: Not hard at all  Food Insecurity: No Food Insecurity (05/15/2022)   Hunger Vital Sign    Worried About Running Out of Food in the Last Year: Never true    Ran Out of Food in the Last Year: Never true  Transportation Needs: No Transportation Needs (05/15/2022)   PRAPARE - Hydrologist (Medical): No    Lack of Transportation (Non-Medical): No  Physical Activity: Sufficiently Active (05/15/2022)    Exercise Vital Sign    Days of Exercise per Week: 7 days    Minutes of Exercise per Session: 150+ min  Stress: No Stress Concern Present (05/15/2022)   [place]    Feeling of Stress : Not at all  Social Connections: Not on file    Tobacco Counseling Counseling given: Not Answered   Clinical Intake:  Pre-visit preparation completed: Yes  Pain : No/denies pain     Nutritional  Status: BMI 25 -29 Overweight Nutritional Risks: None Diabetes: No  How often do you need to have someone help you when you read instructions, pamphlets, or other written materials from your doctor or pharmacy?: 1 - Never  Diabetic?no   Interpreter Needed?: No  Information entered by :: NAllen LPN   Activities of Daily Living    05/15/2022    2:31 PM 05/11/2022    4:29 PM  In your present state of health, do you have any difficulty performing the following activities:  Hearing? 0 0  Vision? 0 0  Difficulty concentrating or making decisions? 0 0  Walking or climbing stairs? 0 0  Dressing or bathing? 0 0  Doing errands, shopping? 0 0  Preparing Food and eating ? N N  Using the Toilet? N N  In the past six months, have you accidently leaked urine? N N  Do you have problems with loss of bowel control? N N  Managing your Medications? N N  Managing your Finances? N N  Housekeeping or managing your Housekeeping? N N    Patient Care Team: Denita Lung, MD as PCP - General (Family Medicine)  Indicate any recent Medical Services you may have received from other than [facility] providers in the past year (date may be approximate).     Assessment:   This is a routine wellness examination for Divit.  Hearing/Vision screen Vision Screening - Comments:: Regular eye exams, [facility]  Dietary issues and exercise activities discussed: Current Exercise Habits: Home exercise routine, Type of exercise: Other - see comments;treadmill  (golf), Time (Minutes): > 60, Frequency (Times/Week): 7, Weekly Exercise (Minutes/Week): 0   Goals Addressed             This Visit's Progress    Patient Stated       05/15/2022, keep living       Depression Screen    05/15/2022    2:31 PM 05/13/2021    2:00 PM 02/05/2021    8:10 AM 12/16/2015    2:16 PM 12/21/2013   10:16 AM 12/15/2012    9:24 AM  PHQ 2/9 Scores  PHQ - 2 Score 0 0 0 0 0 0  PHQ- 9 Score 0         Fall Risk    05/15/2022    2:30 PM 05/11/2022    4:29 PM 05/13/2021    2:00 PM 02/05/2021    8:10 AM 12/16/2015    2:16 PM  Fall Risk   Falls in the past year? 0 0 0 0 No  Number falls in past yr: 0  0 0   Injury with Fall? 0  0 0   Risk for fall due to : Medication side effect  No Fall Risks No Fall Risks   Follow up Falls prevention discussed;Falls evaluation completed;Education provided  Falls evaluation completed Falls evaluation completed     FALL RISK PREVENTION PERTAINING TO THE HOME:  Any stairs in or around the home? Yes  If so, are there any without handrails? No  Home free of loose throw rugs in walkways, pet beds, electrical cords, etc? Yes  Adequate lighting in your home to reduce risk of falls? Yes   ASSISTIVE DEVICES UTILIZED TO PREVENT FALLS:  Life alert? No  Use of a cane, walker or w/c? No  Grab bars in the bathroom? No  Shower chair or bench in shower? No  Elevated toilet seat or a handicapped toilet? No   TIMED UP AND GO:  Was the test performed? Yes .  Length of time to ambulate 10 feet: 5 sec.   Gait steady and fast without use of assistive device  Cognitive Function:        Immunizations Immunization History  Administered Date(s) Administered   Fluad Quad(high Dose 65+) 05/13/2021   Influenza, High Dose Seasonal PF 05/23/2014   Influenza-Unspecified 06/15/2015, 06/30/2016   PFIZER(Purple Top)SARS-COV-2 Vaccination 09/21/2019, 10/16/2019, 05/11/2020, 12/24/2020   Pfizer Covid-19 Vaccine Bivalent Booster 69yr & up 05/13/2021    Pneumococcal Conjugate-13 06/28/2014   Pneumococcal Polysaccharide-23 12/16/2015   Tdap 09/26/2008   Zoster Recombinat (Shingrix) 07/01/2021, 01/29/2022   Zoster, Live 11/16/2011    TDAP status: Up to date  Flu Vaccine status: Due, Education has been provided regarding the importance of this vaccine. Advised may receive this vaccine at local pharmacy or Health Dept. Aware to provide a copy of the vaccination record if obtained from local pharmacy or Health Dept. Verbalized acceptance and understanding.  Pneumococcal vaccine status: Up to date  Covid-19 vaccine status: Completed vaccines  Qualifies for Shingles Vaccine? Yes   Zostavax completed Yes   Shingrix Completed?: Yes  Screening Tests Health Maintenance  Topic Date Due   TETANUS/TDAP  09/26/2018   COVID-19 Vaccine (6 - Pfizer risk series) 07/08/2021   INFLUENZA VACCINE  03/17/2022   COLONOSCOPY (Pts [id number] coverage will need to be confirmed)  06/08/2023   Pneumonia Vaccine 6558Years old  Completed   Hepatitis C Screening  Completed   Zoster Vaccines- Shingrix  Completed   HPV VACCINES  Aged Out    Health Maintenance  Health Maintenance Due  Topic Date Due   TETANUS/TDAP  09/26/2018   COVID-19 Vaccine (6 - Pfizer risk series) 07/08/2021   INFLUENZA VACCINE  03/17/2022    Colorectal cancer screening: Type of screening: Colonoscopy. Completed 06/07/2018. Repeat every 5 years  Lung Cancer Screening: (Low Dose CT Chest recommended if Age 73-80ears, 30 pack-year currently smoking OR have quit w/in 15years.) does not qualify.   Lung Cancer Screening Referral: no  Additional Screening:  Hepatitis C Screening: does qualify; Completed 12/16/2015  Vision Screening: Recommended annual ophthalmology exams for early detection of glaucoma and other disorders of the eye. Is the patient up to date with their annual eye exam?  Yes  Who is the provider or what is the name of the office in which the patient  attends annual eye exams? [facility] provider, would they like to be referred to a provider to establish care? No .   Dental Screening: Recommended annual dental exams for proper oral hygiene  Community Resource Referral / Chronic Care Management: CRR required this visit?  No   CCM required this visit?  No      Plan:     I have personally reviewed and noted the following in the patient's chart:   Medical and social history Use of alcohol, tobacco or illicit drugs  Current medications and supplements including opioid prescriptions. Patient is not currently taking opioid prescriptions. Functional ability and status Nutritional status Physical activity Advanced directives List of other physicians Hospitalizations, surgeries, and ER visits in previous 12 months Vitals Screenings to include cognitive, depression, and falls Referrals and appointments  In addition, I have reviewed and discussed with patient certain preventive protocols, quality metrics, and best practice recommendations. A written personalized care plan for preventive services as well as general preventive health recommendations were provided to patient.     NiPamala Hurry  Alanson Aly, LPN   [id number]   Nurse Notes: 6 CIT not administered. Patient has normal cognition per direct observation.

## 2022-05-15 NOTE — Patient Instructions (Addendum)
Jose Dyer , Thank you for taking time to come for your Medicare Wellness Visit. I appreciate your ongoing commitment to your health goals. Please review the following plan we discussed and let me know if I can assist you in the future.   Screening recommendations/referrals: Colonoscopy: completed 06/07/2018, due 06/07/2028 Recommended yearly ophthalmology/optometry visit for glaucoma screening and checkup Recommended yearly dental visit for hygiene and checkup  Vaccinations: Influenza vaccine: due Pneumococcal vaccine: completed 12/16/2015 Tdap vaccine: completed per patient Shingles vaccine: discussed   Covid-19:  05/13/2021, 12/24/2020, 05/11/2020, 10/16/2019, 09/21/2019  Advanced directives: Please bring a copy of your POA (Power of Attorney) and/or Living Will to your next appointment.    Conditions/risks identified: none  Next appointment: Follow up in one year for your annual wellness visit.   Preventive Care 32 Years and Older, Male Preventive care refers to lifestyle choices and visits with your health care provider that can promote health and wellness. What does preventive care include? A yearly physical exam. This is also called an annual well check. Dental exams once or twice a year. Routine eye exams. Ask your health care provider how often you should have your eyes checked. Personal lifestyle choices, including: Daily care of your teeth and gums. Regular physical activity. Eating a healthy diet. Avoiding tobacco and drug use. Limiting alcohol use. Practicing safe sex. Taking low doses of aspirin every day. Taking vitamin and mineral supplements as recommended by your health care provider. What happens during an annual well check? The services and screenings done by your health care provider during your annual well check will depend on your age, overall health, lifestyle risk factors, and family history of disease. Counseling  Your health care provider may ask you  questions about your: Alcohol use. Tobacco use. Drug use. Emotional well-being. Home and relationship well-being. Sexual activity. Eating habits. History of falls. Memory and ability to understand (cognition). Work and work Statistician. Screening  You may have the following tests or measurements: Height, weight, and BMI. Blood pressure. Lipid and cholesterol levels. These may be checked every 5 years, or more frequently if you are over 32 years old. Skin check. Lung cancer screening. You may have this screening every year starting at age 80 if you have a 30-pack-year history of smoking and currently smoke or have quit within the past 15 years. Fecal occult blood test (FOBT) of the stool. You may have this test every year starting at age 3. Flexible sigmoidoscopy or colonoscopy. You may have a sigmoidoscopy every 5 years or a colonoscopy every 10 years starting at age 72. Prostate cancer screening. Recommendations will vary depending on your family history and other risks. Hepatitis C blood test. Hepatitis B blood test. Sexually transmitted disease (STD) testing. Diabetes screening. This is done by checking your blood sugar (glucose) after you have not eaten for a while (fasting). You may have this done every 1-3 years. Abdominal aortic aneurysm (AAA) screening. You may need this if you are a current or former smoker. Osteoporosis. You may be screened starting at age 39 if you are at high risk. Talk with your health care provider about your test results, treatment options, and if necessary, the need for more tests. Vaccines  Your health care provider may recommend certain vaccines, such as: Influenza vaccine. This is recommended every year. Tetanus, diphtheria, and acellular pertussis (Tdap, Td) vaccine. You may need a Td booster every 10 years. Zoster vaccine. You may need this after age 62. Pneumococcal 13-valent conjugate (PCV13) vaccine. One  dose is recommended after age  21. Pneumococcal polysaccharide (PPSV23) vaccine. One dose is recommended after age 20. Talk to your health care provider about which screenings and vaccines you need and how often you need them. This information is not intended to replace advice given to you by your health care provider. Make sure you discuss any questions you have with your health care provider. Document Released: 08/30/2015 Document Revised: 04/22/2016 Document Reviewed: 06/04/2015 Elsevier Interactive Patient Education  2017 [place] Prevention in the Home Falls can cause injuries. They can happen to people of all ages. There are many things you can do to make your home safe and to help prevent falls. What can I do on the outside of my home? Regularly fix the edges of walkways and driveways and fix any cracks. Remove anything that might make you trip as you walk through a door, such as a raised step or threshold. Trim any bushes or trees on the path to your home. Use bright outdoor lighting. Clear any walking paths of anything that might make someone trip, such as rocks or tools. Regularly check to see if handrails are loose or broken. Make sure that both sides of any steps have handrails. Any raised decks and porches should have guardrails on the edges. Have any leaves, snow, or ice cleared regularly. Use sand or salt on walking paths during winter. Clean up any spills in your garage right away. This includes oil or grease spills. What can I do in the bathroom? Use night lights. Install grab bars by the toilet and in the tub and shower. Do not use towel bars as grab bars. Use non-skid mats or decals in the tub or shower. If you need to sit down in the shower, use a plastic, non-slip stool. Keep the floor dry. Clean up any water that spills on the floor as soon as it happens. Remove soap buildup in the tub or shower regularly. Attach bath mats securely with double-sided non-slip rug tape. Do not have throw  rugs and other things on the floor that can make you trip. What can I do in the bedroom? Use night lights. Make sure that you have a light by your bed that is easy to reach. Do not use any sheets or blankets that are too big for your bed. They should not hang down onto the floor. Have a firm chair that has side arms. You can use this for support while you get dressed. Do not have throw rugs and other things on the floor that can make you trip. What can I do in the kitchen? Clean up any spills right away. Avoid walking on wet floors. Keep items that you use a lot in easy-to-reach places. If you need to reach something above you, use a strong step stool that has a grab bar. Keep electrical cords out of the way. Do not use floor polish or wax that makes floors slippery. If you must use wax, use non-skid floor wax. Do not have throw rugs and other things on the floor that can make you trip. What can I do with my stairs? Do not leave any items on the stairs. Make sure that there are handrails on both sides of the stairs and use them. Fix handrails that are broken or loose. Make sure that handrails are as long as the stairways. Check any carpeting to make sure that it is firmly attached to the stairs. Fix any carpet that is loose or worn.  Avoid having throw rugs at the top or bottom of the stairs. If you do have throw rugs, attach them to the floor with carpet tape. Make sure that you have a light switch at the top of the stairs and the bottom of the stairs. If you do not have them, ask someone to add them for you. What else can I do to help prevent falls? Wear shoes that: Do not have high heels. Have rubber bottoms. Are comfortable and fit you well. Are closed at the toe. Do not wear sandals. If you use a stepladder: Make sure that it is fully opened. Do not climb a closed stepladder. Make sure that both sides of the stepladder are locked into place. Ask someone to hold it for you, if  possible. Clearly mark and make sure that you can see: Any grab bars or handrails. First and last steps. Where the edge of each step is. Use tools that help you move around (mobility aids) if they are needed. These include: Canes. Walkers. Scooters. Crutches. Turn on the lights when you go into a dark area. Replace any light bulbs as soon as they burn out. Set up your furniture so you have a clear path. Avoid moving your furniture around. If any of your floors are uneven, fix them. If there are any pets around you, be aware of where they are. Review your medicines with your doctor. Some medicines can make you feel dizzy. This can increase your chance of falling. Ask your doctor what other things that you can do to help prevent falls. This information is not intended to replace advice given to you by your health care provider. Make sure you discuss any questions you have with your health care provider. Document Released: 05/30/2009 Document Revised: 01/09/2016 Document Reviewed: 09/07/2014 Elsevier Interactive Patient Education  2017 [employer].

## 2022-05-18 ENCOUNTER — Encounter: Payer: Self-pay | Admitting: Family Medicine

## 2022-05-18 ENCOUNTER — Ambulatory Visit (INDEPENDENT_AMBULATORY_CARE_PROVIDER_SITE_OTHER): Payer: Medicare Other | Admitting: Family Medicine

## 2022-05-18 VITALS — BP 136/66 | HR 48 | Temp 97.0°F | Ht 67.0 in | Wt 169.0 lb

## 2022-05-18 DIAGNOSIS — I25118 Atherosclerotic heart disease of native coronary artery with other forms of angina pectoris: Secondary | ICD-10-CM | POA: Diagnosis not present

## 2022-05-18 DIAGNOSIS — I10 Essential (primary) hypertension: Secondary | ICD-10-CM | POA: Diagnosis not present

## 2022-05-18 DIAGNOSIS — Z0000 Encounter for general adult medical examination without abnormal findings: Secondary | ICD-10-CM | POA: Diagnosis not present

## 2022-05-18 DIAGNOSIS — E785 Hyperlipidemia, unspecified: Secondary | ICD-10-CM

## 2022-05-18 DIAGNOSIS — Z125 Encounter for screening for malignant neoplasm of prostate: Secondary | ICD-10-CM

## 2022-05-18 DIAGNOSIS — H35033 Hypertensive retinopathy, bilateral: Secondary | ICD-10-CM

## 2022-05-18 DIAGNOSIS — Z23 Encounter for immunization: Secondary | ICD-10-CM

## 2022-05-18 DIAGNOSIS — Z789 Other specified health status: Secondary | ICD-10-CM

## 2022-05-18 DIAGNOSIS — I6389 Other cerebral infarction: Secondary | ICD-10-CM

## 2022-05-18 DIAGNOSIS — K219 Gastro-esophageal reflux disease without esophagitis: Secondary | ICD-10-CM

## 2022-05-18 DIAGNOSIS — R972 Elevated prostate specific antigen [PSA]: Secondary | ICD-10-CM

## 2022-05-18 DIAGNOSIS — Z9849 Cataract extraction status, unspecified eye: Secondary | ICD-10-CM | POA: Insufficient documentation

## 2022-05-18 DIAGNOSIS — Z947 Corneal transplant status: Secondary | ICD-10-CM | POA: Diagnosis not present

## 2022-05-18 NOTE — Patient Instructions (Signed)
Health Maintenance, Male Adopting a healthy lifestyle and getting preventive care are important in promoting health and wellness. Ask your health care provider about: The right schedule for you to have regular tests and exams. Things you can do on your own to prevent diseases and keep yourself healthy. What should I know about diet, weight, and exercise? Eat a healthy diet  Eat a diet that includes plenty of vegetables, fruits, low-fat dairy products, and lean protein. Do not eat a lot of foods that are high in solid fats, added sugars, or sodium. Maintain a healthy weight Body mass index (BMI) is a measurement that can be used to identify possible weight problems. It estimates body fat based on height and weight. Your health care provider can help determine your BMI and help you achieve or maintain a healthy weight. Get regular exercise Get regular exercise. This is one of the most important things you can do for your health. Most adults should: Exercise for at least 150 minutes each week. The exercise should increase your heart rate and make you sweat (moderate-intensity exercise). Do strengthening exercises at least twice a week. This is in addition to the moderate-intensity exercise. Spend less time sitting. Even light physical activity can be beneficial. Watch cholesterol and blood lipids Have your blood tested for lipids and cholesterol at 73 years of age, then have this test every 5 years. You may need to have your cholesterol levels checked more often if: Your lipid or cholesterol levels are high. You are older than 73 years of age. You are at high risk for heart disease. What should I know about cancer screening? Many types of cancers can be detected early and may often be prevented. Depending on your health history and family history, you may need to have cancer screening at various ages. This may include screening for: Colorectal cancer. Prostate cancer. Skin cancer. Lung  cancer. What should I know about heart disease, diabetes, and high blood pressure? Blood pressure and heart disease High blood pressure causes heart disease and increases the risk of stroke. This is more likely to develop in people who have high blood pressure readings or are overweight. Talk with your health care provider about your target blood pressure readings. Have your blood pressure checked: Every 3-5 years if you are 18-39 years of age. Every year if you are 40 years old or older. If you are between the ages of 65 and 75 and are a current or former smoker, ask your health care provider if you should have a one-time screening for abdominal aortic aneurysm (AAA). Diabetes Have regular diabetes screenings. This checks your fasting blood sugar level. Have the screening done: Once every three years after age 45 if you are at a normal weight and have a low risk for diabetes. More often and at a younger age if you are overweight or have a high risk for diabetes. What should I know about preventing infection? Hepatitis B If you have a higher risk for hepatitis B, you should be screened for this virus. Talk with your health care provider to find out if you are at risk for hepatitis B infection. Hepatitis C Blood testing is recommended for: Everyone born from 1945 through 1965. Anyone with known risk factors for hepatitis C. Sexually transmitted infections (STIs) You should be screened each year for STIs, including gonorrhea and chlamydia, if: You are sexually active and are younger than 73 years of age. You are older than 73 years of age and your   health care provider tells you that you are at risk for this type of infection. Your sexual activity has changed since you were last screened, and you are at increased risk for chlamydia or gonorrhea. Ask your health care provider if you are at risk. Ask your health care provider about whether you are at high risk for HIV. Your health care provider  may recommend a prescription medicine to help prevent HIV infection. If you choose to take medicine to prevent HIV, you should first get tested for HIV. You should then be tested every 3 months for as long as you are taking the medicine. Follow these instructions at home: Alcohol use Do not drink alcohol if your health care provider tells you not to drink. If you drink alcohol: Limit how much you have to 0-2 drinks a day. Know how much alcohol is in your drink. In the [place] one drink equals one 12 oz bottle of beer (355 mL), one 5 oz glass of wine (148 mL), or one 1 oz glass of hard liquor (44 mL). Lifestyle Do not use any products that contain nicotine or tobacco. These products include cigarettes, chewing tobacco, and vaping devices, such as e-cigarettes. If you need help quitting, ask your health care provider. Do not use street drugs. Do not share needles. Ask your health care provider for help if you need support or information about quitting drugs. General instructions Schedule regular health, dental, and eye exams. Stay current with your vaccines. Tell your health care provider if: You often feel depressed. You have ever been abused or do not feel safe at home. Summary Adopting a healthy lifestyle and getting preventive care are important in promoting health and wellness. Follow your health care provider's instructions about healthy diet, exercising, and getting tested or screened for diseases. Follow your health care provider's instructions on monitoring your cholesterol and blood pressure. This information is not intended to replace advice given to you by your health care provider. Make sure you discuss any questions you have with your health care provider. Document Revised: 12/23/2020 Document Reviewed: 12/23/2020 Elsevier Patient Education  2023 Elsevier Inc.  

## 2022-05-18 NOTE — Progress Notes (Signed)
Complete physical exam  Patient: Jose Dyer   DOB: 1951/01/02   70 y.o. Male  MRN: [id number]  Subjective:    Chief Complaint  Patient presents with   Annual Exam    Fasting     Jose Dyer is a 71 y.o. male who presents today for a complete physical exam. He reports consuming a general diet. Home exercise routine includes walking 4 hrs per day. [place] and work out at home QD  He generally feels well. He reports sleeping well.  He does have a history of colonic polyps and is scheduled for routine follow-up on that.  He has a remote history of CVA but no neurologic symptoms since then.  His immunizations were reviewed.  He would like to be screened for prostate cancer as his last PSA was in the 3 range.   Most recent fall risk assessment:    05/15/2022    2:30 PM  [place] in the past year? 0  Number falls in past yr: 0  Injury with Fall? 0  Risk for fall due to : Medication side effect  Follow up Falls prevention discussed;Falls evaluation completed;Education provided     Most recent depression screenings:    05/15/2022    2:31 PM 05/13/2021    2:00 PM  PHQ 2/9 Scores  PHQ - 2 Score 0 0  PHQ- 9 Score 0       Patient Active Problem List   Diagnosis Date Noted   History of cataract extraction 05/18/2022   Elevated PSA 05/18/2022   Gastroesophageal reflux disease without esophagitis 05/13/2021   Hypertensive retinopathy of both eyes 08/27/2020   Nuclear sclerosis of right eye 08/27/2020   Pseudophakia of left eye 08/27/2020   Status post corneal transplant 08/27/2020   Corneal scar, right eye 02/22/2020   Coronary artery disease: Cath a. (May 2018) succ PTCA of distal circ, unsucc at CTO prox LAD. b. (June 2018) successful CTO of prox to mid LAD  01/28/2017   Post PTCA 12/15/2016   Statin intolerance 06/28/2014   Cerebral infarction ([place]) 01/30/2014   Hyperlipidemia LDL goal <100 01/30/2014   Hypertension 12/15/2012   Past Medical History:  Diagnosis  Date   Anginal pain (HCC)    Corneal scarring    Coronary artery disease    Dyslipidemia    FHx: cardiovascular disease    GERD (gastroesophageal reflux disease)    History of hiatal hernia    History of kidney stones    "none since the 1990s" (01/27/2017)   Hypertension    Stroke ([place]) 2014   denies residual on 01/27/2017   Past Surgical History:  Procedure Laterality Date   CATARACT EXTRACTION Left    COLONOSCOPY  2004   CORONARY ANGIOPLASTY WITH STENT PLACEMENT  01/27/2017   CORONARY CTO INTERVENTION N/A 01/27/2017   Procedure: Coronary CTO Intervention;  Surgeon: Jettie Booze, MD;  Location: [facility];  Service: Cardiovascular;  Laterality: N/A;   CORONARY STENT INTERVENTION  12/15/2016    Successful PTCA and stenting of the distal circumflex with 3.0 x 20 mm Synergy, stenosis reduced from 90% to 0% with maintenance of TIMI-3 to TIMI-3 flow.   CORONARY STENT INTERVENTION N/A 12/15/2016   Procedure: Coronary Stent Intervention;  Surgeon: Adrian Prows, MD;  Location: [facility];  Service: Cardiovascular;  Laterality: N/A;  LAD   CORONARY STENT INTERVENTION N/A 01/27/2017   Procedure: Coronary Stent Intervention;  Surgeon: Jettie Booze, MD;  Location:  [facility];  Service: Cardiovascular;  Laterality: N/A;   EYE TRANSPLANT Right    LEFT HEART CATH AND CORONARY ANGIOGRAPHY N/A 12/15/2016   Procedure: Left Heart Cath and Coronary Angiography;  Surgeon: Adrian Prows, MD;  Location: [facility];  Service: Cardiovascular;  Laterality: N/A;   LEFT HEART CATH AND CORONARY ANGIOGRAPHY N/A 01/27/2017   Procedure: Left Heart Cath and Coronary Angiography;  Surgeon: Jettie Booze, MD;  Location: [facility];  Service: Cardiovascular;  Laterality: N/A;   Social History   Tobacco Use   Smoking status: Never    Passive exposure: Never   Smokeless tobacco: Never  Vaping Use   Vaping Use: Never used  Substance Use Topics   Alcohol use: No    Drug use: No      Patient Care Team: Denita Lung, MD as PCP - General (Family Medicine)   Outpatient Medications Prior to Visit  Medication Sig   amLODipine (NORVASC) 5 MG tablet TAKE 1 TABLET BY MOUTH IN  THE EVENING AFTER DINNER   aspirin 81 MG tablet Take 81 mg by mouth daily.   Evolocumab (REPATHA) 140 MG/ML SOSY Inject 1 pen into the skin every 30 (thirty) days.   ezetimibe (ZETIA) 10 MG tablet Take 1 tablet (10 mg total) by mouth daily.   Multiple Vitamin (MULTI-VITAMIN) tablet Take 1 tablet by mouth daily.   pantoprazole (PROTONIX) 40 MG tablet TAKE 1 TABLET BY MOUTH  DAILY   prednisoLONE acetate (PRED FORTE) 1 % ophthalmic suspension Place 1 drop into the right eye in the morning and at bedtime.   LORazepam (ATIVAN) 1 MG tablet Take 1 tablet 30 minutes prior to leaving your house on day of office surgery.  Bring second tablet with you to office on day of office surgery. (Patient not taking: Reported on 05/15/2022)   nitroGLYCERIN (NITROSTAT) 0.4 MG SL tablet Place 1 tablet (0.4 mg total) under the tongue every 5 (five) minutes as needed for chest pain. (Patient not taking: Reported on 05/18/2022)   [DISCONTINUED] REPATHA SURECLICK 595 MG/ML SOAJ INJECT 1 PEN (1ML) INTO THE SKIN EVERY 14 DAYS   No facility-administered medications prior to visit.    Review of Systems  All other systems reviewed and are negative.         Objective:     BP 136/66   Pulse (!) 48   Temp (!) 97 F (36.1 C)   Ht '5\' 7"'  (1.702 m)   Wt 169 lb (76.7 kg)   SpO2 97%   BMI 26.47 kg/m  BP Readings from Last 3 Encounters:  05/18/22 136/66  05/15/22 (!) 140/70  04/01/22 (!) 158/69   Wt Readings from Last 3 Encounters:  05/18/22 169 lb (76.7 kg)  05/15/22 169 lb 6.4 oz (76.8 kg)  04/01/22 168 lb (76.2 kg)      Physical Exam  Alert and in no distress. Tympanic membranes and canals are normal. Pharyngeal area is normal. Neck is supple without adenopathy or thyromegaly. Cardiac exam shows  a regular sinus rhythm without murmurs or gallops. Lungs are clear to auscultation.  No results found for any visits on 05/18/22. Last CBC Lab Results  Component Value Date   WBC 8.3 01/07/2022   HGB 14.2 01/07/2022   HCT 41.7 01/07/2022   MCV 89 01/07/2022   MCH 30.3 01/07/2022   RDW 12.6 01/07/2022   PLT 208 39/67/2897   Last metabolic panel Lab Results  Component Value Date   GLUCOSE  102 (H) 01/07/2022   NA 143 01/07/2022   K 4.4 01/07/2022   CL 105 01/07/2022   CO2 25 01/07/2022   BUN 22 01/07/2022   CREATININE 0.89 01/07/2022   EGFR 90 01/07/2022   CALCIUM 9.1 01/07/2022   PROT 6.9 01/07/2022   ALBUMIN 4.5 01/07/2022   LABGLOB 2.4 01/07/2022   AGRATIO 1.9 01/07/2022   BILITOT 0.4 01/07/2022   ALKPHOS 74 01/07/2022   AST 19 01/07/2022   ALT 15 01/07/2022   ANIONGAP 12 09/04/2018   Last lipids Lab Results  Component Value Date   CHOL 138 01/07/2022   HDL 45 01/07/2022   LDLCALC 79 01/07/2022   LDLDIRECT 53 12/30/2020   TRIG 67 01/07/2022   CHOLHDL 4.3 09/22/2016        Assessment & Plan:  Routine general medical examination at a health care facility  Coronary artery disease of native artery of native heart with stable angina pectoris ([place])  Primary hypertension  Gastroesophageal reflux disease without esophagitis  Cerebral infarction due to other mechanism ([place]) - 2015  Hyperlipidemia LDL goal <100  Hypertensive retinopathy of both eyes  Statin intolerance  Status post corneal transplant  Screening for prostate cancer - Plan: PSA  Elevated PSA - Plan: PSA  Need for influenza vaccination - Plan: Flu Vaccine QUAD High Dose(Fluad)  History of cataract extraction, unspecified laterality   Immunization History  Administered Date(s) Administered   Fluad Quad(high Dose 65+) 05/13/2021, 05/18/2022   Influenza, High Dose Seasonal PF 05/23/2014   Influenza-Unspecified 06/15/2015, 06/30/2016   PFIZER(Purple Top)SARS-COV-2 Vaccination  09/21/2019, 10/16/2019, 05/11/2020, 12/24/2020   Pfizer Covid-19 Vaccine Bivalent Booster 14yr & up 05/13/2021   Pneumococcal Conjugate-13 06/28/2014   Pneumococcal Polysaccharide-23 12/16/2015   Tdap 09/26/2008   Zoster Recombinat (Shingrix) 07/01/2021, 01/29/2022   Zoster, Live 11/16/2011    Health Maintenance  Topic Date Due   TETANUS/TDAP  09/26/2018   COVID-19 Vaccine (6 - Pfizer risk series) 07/08/2021   COLONOSCOPY (Pts 45-450yrInsurance coverage will need to be confirmed)  06/08/2023   Pneumonia Vaccine 6549Years old  Completed   INFLUENZA VACCINE  Completed   Hepatitis C Screening  Completed   Zoster Vaccines- Shingrix  Completed   HPV VACCINES  Aged Out    Discussed health benefits of physical activity, and encouraged him to engage in regular exercise appropriate for his age and condition.  Problem List Items Addressed This Visit     [facility]  Coronary artery disease: Cath a. (May 2018) succ PTCA of distal circ, unsucc at CTO prox LAD. b. (June 2018) successful CTO of prox to mid LAD    Elevated PSA   Relevant Orders   PSA   Gastroesophageal reflux disease without esophagitis   History of cataract extraction   Hyperlipidemia LDL goal <100   Hypertension   Hypertensive retinopathy of both eyes   Statin intolerance   Status post corneal transplant   Other Visit Diagnoses     Routine general medical examination at a health care facility    -  Primary   Screening for prostate cancer       Relevant Orders   PSA   Need for influenza vaccination       Relevant Orders   Flu Vaccine QUAD High Dose(Fluad) (Completed)     He will continue on his present medication regimen except use Protonix on an as-needed basis.  Also recommend he get Tdap, COVID and RSV vaccines. Return in about 1 year (around 05/19/2023)  for awv/cpe.     Jill Alexanders, MD

## 2022-05-19 LAB — PSA: Prostate Specific Ag, Serum: 3.1 ng/mL (ref 0.0–4.0)

## 2022-05-21 ENCOUNTER — Encounter: Payer: Self-pay | Admitting: Vascular Surgery

## 2022-05-21 ENCOUNTER — Ambulatory Visit: Payer: Medicare Other | Admitting: Vascular Surgery

## 2022-05-21 VITALS — BP 136/62 | HR 56 | Temp 98.0°F | Resp 16 | Ht 68.0 in | Wt 165.0 lb

## 2022-05-21 DIAGNOSIS — I872 Venous insufficiency (chronic) (peripheral): Secondary | ICD-10-CM

## 2022-05-21 HISTORY — PX: ENDOVENOUS ABLATION SAPHENOUS VEIN W/ LASER: SUR449

## 2022-05-21 NOTE — Progress Notes (Signed)
     Laser Ablation Procedure     Date: 05/21/2022   Jose Dyer DOB:21-Feb-1951  Consent signed: Yes      Surgeon: Servando Snare MD   Procedure: Laser Ablation: left Greater Saphenous Vein  BP 136/62 (BP Location: Right Arm, Patient Position: Sitting, Cuff Size: Normal)   Pulse (!) 56   Temp 98 F (36.7 C) (Temporal)   Resp 16   Ht '5\' 8"'$  (1.727 m)   Wt 165 lb (74.8 kg)   SpO2 98%   BMI 25.09 kg/m   Tumescent Anesthesia: 500 cc 0.9% NaCl with 50 cc Lidocaine HCL 1%  and 15 cc 8.4% NaHCO3  Local Anesthesia: 24 cc Lidocaine HCL and NaHCO3 (ratio 2:1)  7 watts continuous mode     Total energy: 1554 Joules    Total time: 222 seconds  Treatment Length 31 cm   Laser Fiber Ref. #  [id number]  Lot # [id number]   Stab Phlebectomy: >20 Sites: Calf  Patient tolerated procedure well  Notes:  All staff members wore facial masks.    Description of Procedure:  After marking the course of the secondary varicosities, the patient was placed on the operating table in the supine position, and the left leg was prepped and draped in sterile fashion.   Local anesthetic was administered and under ultrasound guidance the saphenous vein was accessed with a micro needle and guide wire; then the mirco puncture sheath was placed.  A guide wire was inserted saphenofemoral junction , followed by a 5 french sheath.  The position of the sheath and then the laser fiber below the junction was confirmed using the ultrasound.  Tumescent anesthesia was administered along the course of the saphenous vein using ultrasound guidance. The patient was placed in Trendelenburg position and protective laser glasses were placed on patient and staff, and the laser was fired at 7 watts continuous mode for a total of 1554 Joules.   For stab phlebectomies, local anesthetic was administered at the previously marked varicosities, and tumescent anesthesia was administered around the vessels.  Greater than 20 stab wounds were made  using the tip of an 11 blade. And using the vein hook, the phlebectomies were performed using a hemostat to avulse the varicosities.  Adequate hemostasis was achieved.     Steri strips were applied to the stab wounds and ABD pads and thigh high compression stockings were applied.  Ace wrap bandages were applied over the phlebectomy sites and at the top of the saphenofemoral junction. Blood loss was less than 15 cc.  Discharge instructions reviewed with patient and hardcopy of discharge instructions given to patient to take home. The patient ambulated out of the operating room having tolerated the procedure well.

## 2022-05-21 NOTE — Progress Notes (Signed)
Patient name: Jose Dyer MRN: [id number] DOB: 20-Sep-1950 Sex: male  REASON FOR VISIT: Left greater saphenous vein ablation and stab phlebectomy  HPI: Jose Dyer is a 71 y.o. male has a long history of large refluxing varicose veins with discomfort.  He has been compliant with compression socks.  We have discussed the need for bilateral great saphenous vein ablation and stab phlebectomy he presents to have the left leg treated today.  He has continued to play golf daily and does not have any new complaints.  Current Outpatient Medications  Medication Sig Dispense Refill   amLODipine (NORVASC) 5 MG tablet TAKE 1 TABLET BY MOUTH IN  THE EVENING AFTER DINNER 90 tablet 3   aspirin 81 MG tablet Take 81 mg by mouth daily.     Evolocumab (REPATHA) 140 MG/ML SOSY Inject 1 pen into the skin every 30 (thirty) days. 6.3 mL 3   ezetimibe (ZETIA) 10 MG tablet Take 1 tablet (10 mg total) by mouth daily. 90 tablet 3   LORazepam (ATIVAN) 1 MG tablet Take 1 tablet 30 minutes prior to leaving your house on day of office surgery.  Bring second tablet with you to office on day of office surgery. (Patient not taking: Reported on 05/15/2022) 2 tablet 0   Multiple Vitamin (MULTI-VITAMIN) tablet Take 1 tablet by mouth daily.     nitroGLYCERIN (NITROSTAT) 0.4 MG SL tablet Place 1 tablet (0.4 mg total) under the tongue every 5 (five) minutes as needed for chest pain. (Patient not taking: Reported on 05/18/2022) 25 tablet 12   pantoprazole (PROTONIX) 40 MG tablet TAKE 1 TABLET BY MOUTH  DAILY 90 tablet 3   prednisoLONE acetate (PRED FORTE) 1 % ophthalmic suspension Place 1 drop into the right eye in the morning and at bedtime.     No current facility-administered medications for this visit.    PHYSICAL EXAM: Vitals:   05/21/22 0842  BP: 136/62  Pulse: (!) 56  Resp: 16  Temp: 98 F (36.7 C)  TempSrc: Temporal  SpO2: 98%  Weight: 165 lb (74.8 kg)  Height: '5\' 8"'$  (1.727 m)   Awake alert and  oriented Nonlabored respirations Left leg varicosities marked with the patient standing and great saphenous vein marked with the patient in the supine position  PROCEDURE: Left greater saphenous vein ablation totaling 31 cm and left lower extremity stab phlebectomy greater than 20  TECHNIQUE: Patient was sterilely prepped and draped in the left lower extremity timeout was called.  The saphenous vein again was visualized with ultrasound throughout the upper leg from the knee to the groin.  The area around the knee was anesthetized and the vein was cannulated with micropuncture needle followed by wire and the sheath.  A J-wire was then placed 2 to 3 cm from the saphenofemoral junction under ultrasound guidance and a 45 cm laser catheter was placed and visualized with ultrasound.  The laser was then placed to 3 cm from the saphenofemoral junction and after tumescent anesthesia was instilled along the entirety of the great saphenous vein s Supples leg laser ablation was performed for a total of 31 cm.  At completion the common femoral vein was compressible and the saphenous vein was traced with ultrasound and noted to be sclerotic.  Attention was then turned to the multiple varicosities mostly around the medial and lower anterior left leg.  Greater than 20 stab phlebectomies were created after anesthesia was introduced and the varicosities were removed using a combination of hook  and hemostat.  At completion the stab sites were hemostatic and sterile dressing was applied.  He tolerated the procedure well without immediate complication.  Servando Snare [facility]

## 2022-05-26 ENCOUNTER — Encounter: Payer: Self-pay | Admitting: Internal Medicine

## 2022-05-27 ENCOUNTER — Ambulatory Visit (HOSPITAL_COMMUNITY)
Admission: RE | Admit: 2022-05-27 | Discharge: 2022-05-27 | Disposition: A | Discharge status: Home / Self Care | Payer: Medicare Other | Source: Ambulatory Visit | Attending: Vascular Surgery | Admitting: Vascular Surgery

## 2022-05-27 ENCOUNTER — Ambulatory Visit (INDEPENDENT_AMBULATORY_CARE_PROVIDER_SITE_OTHER): Payer: Medicare Other | Admitting: Vascular Surgery

## 2022-05-27 ENCOUNTER — Encounter: Payer: Self-pay | Admitting: Vascular Surgery

## 2022-05-27 VITALS — BP 134/74 | HR 52 | Temp 98.4°F | Resp 20 | Ht 68.0 in | Wt 168.0 lb

## 2022-05-27 DIAGNOSIS — I872 Venous insufficiency (chronic) (peripheral): Secondary | ICD-10-CM

## 2022-05-27 DIAGNOSIS — I83892 Varicose veins of left lower extremities with other complications: Secondary | ICD-10-CM | POA: Diagnosis not present

## 2022-05-27 NOTE — Progress Notes (Signed)
Subjective:     Patient ID: Jose Dyer, male   DOB: 1951-04-11, 71 y.o.   MRN: [id number]  HPI 71 year old male status post left greater saphenous vein ablation and multiple stab phlebectomies.  He has had some pain also a rash in the left upper thigh which is mostly resolving.  He did walk 18 holes of golf today.  Denies any swelling of the leg.   Review of Systems Rash    Objective:   Physical Exam Vitals:   05/27/22 1425  BP: 134/74  Pulse: (!) 52  Resp: 20  Temp: 98.4 F (36.9 C)  SpO2: 98%    Awake alert oriented Left lower extremity healing well status post laser ablation and multiple stabs There is minimal residual rash to the left upper thigh   LEFT          Reflux  Reflux  Reflux  Diameter cmsComments                                No       Yes     Time                                         +--------------+--------+------+----------+------------+-------------------  ----+  CFV                    yes  >1 second                                       +--------------+--------+------+----------+------------+-------------------  ----+  FV prox                yes  >1 second                                       +--------------+--------+------+----------+------------+-------------------  ----+  FV mid        no                                                            +--------------+--------+------+----------+------------+-------------------  ----+  FV dist       no                                                            +--------------+--------+------+----------+------------+-------------------  ----+  Popliteal     no                                                            +--------------+--------+------+----------+------------+-------------------  ----+  GSV at [facility]  prior                                                                         ablation/stripping        +--------------+--------+------+----------+------------+-------------------  ----+  GSV prox thigh                                    prior                                                                        ablation/stripping        +--------------+--------+------+----------+------------+-------------------  ----+  GSV mid thigh                                     prior                                                                        ablation/stripping        +--------------+--------+------+----------+------------+-------------------  ----+  GSV dist thigh                                    prior                                                                        ablation/stripping        +--------------+--------+------+----------+------------+-------------------  ----+  GSV at knee                                       prior                                                                        ablation/stripping        +--------------+--------+------+----------+------------+-------------------  ----+  GSV prox calf          yes   >500 ms      0.51  prior                                                                        ablation/stripping        +--------------+--------+------+----------+------------+-------------------  ----+  SSV Pop Fossa no                          0.18                              +--------------+--------+------+----------+------------+-------------------  ----+  SSV prox calf no                          0.11                              +--------------+--------+------+----------+------------+-------------------  ----+           Summary:  Right:     Left:  - No evidence of deep vein thrombosis seen in the left lower extremity,  from the common femoral through the popliteal veins.   - There is partially  occlusive superficial thrombus in the LT GSV at the  proximal calf level likely associated with recent GSV ablation and Stab  phlebectomy     - Venous reflux is noted in the left common femoral vein.  - Venous reflux is noted in the left profunda femoral vein.  - Venous reflux is noted in the left greater saphenous vein in the calf.  -This reflux is likely due to proximal GSV ablation.     - Venous reflux is noted in the left femoral vein.     Assessment:     71 year old male status post successful left greater saphenous vein ablation and stab lobectomies without DVT    Plan:     Continue compression stocking left lower extremity  He has scheduled procedure of the right lower extremity next week  Hanz Winterhalter C. Donzetta Matters, MD [facility] Office: [phone] Pager: [phone]

## 2022-06-04 ENCOUNTER — Encounter: Payer: Self-pay | Admitting: Vascular Surgery

## 2022-06-04 ENCOUNTER — Ambulatory Visit: Payer: Medicare Other | Admitting: Vascular Surgery

## 2022-06-04 VITALS — BP 126/59 | HR 47 | Temp 98.4°F | Resp 16 | Ht 68.0 in | Wt 165.0 lb

## 2022-06-04 DIAGNOSIS — I83891 Varicose veins of right lower extremities with other complications: Secondary | ICD-10-CM

## 2022-06-04 DIAGNOSIS — I872 Venous insufficiency (chronic) (peripheral): Secondary | ICD-10-CM

## 2022-06-04 HISTORY — PX: ENDOVENOUS ABLATION SAPHENOUS VEIN W/ LASER: SUR449

## 2022-06-04 NOTE — Progress Notes (Signed)
Patient name: Jose Dyer MRN: [id number] DOB: 08-20-1950 Sex: male  REASON FOR VISIT: Right greater saphenous vein ablation and stab phlebectomy  HPI: Jose Dyer is a 71 y.o. male with a long history of large refluxing varicose veins with pain.  He has undergone left greater saphenous vein ablation and stab phlebectomy greater than 20 and now returns for treatment of the right lower extremity.  He has been compliant with compression stockings.  He has been active since our last procedure with only minimal pain in the left leg.  Current Outpatient Medications  Medication Sig Dispense Refill   amLODipine (NORVASC) 5 MG tablet TAKE 1 TABLET BY MOUTH IN  THE EVENING AFTER DINNER 90 tablet 3   aspirin 81 MG tablet Take 81 mg by mouth daily.     Evolocumab (REPATHA) 140 MG/ML SOSY Inject 1 pen into the skin every 30 (thirty) days. 6.3 mL 3   ezetimibe (ZETIA) 10 MG tablet Take 1 tablet (10 mg total) by mouth daily. 90 tablet 3   Multiple Vitamin (MULTI-VITAMIN) tablet Take 1 tablet by mouth daily.     nitroGLYCERIN (NITROSTAT) 0.4 MG SL tablet Place 1 tablet (0.4 mg total) under the tongue every 5 (five) minutes as needed for chest pain. 25 tablet 12   pantoprazole (PROTONIX) 40 MG tablet TAKE 1 TABLET BY MOUTH  DAILY 90 tablet 3   prednisoLONE acetate (PRED FORTE) 1 % ophthalmic suspension Place 1 drop into the right eye in the morning and at bedtime.     LORazepam (ATIVAN) 1 MG tablet Take 1 tablet 30 minutes prior to leaving your house on day of office surgery.  Bring second tablet with you to office on day of office surgery. (Patient not taking: Reported on 06/04/2022) 2 tablet 0   No current facility-administered medications for this visit.    PHYSICAL EXAM: Vitals:   06/04/22 0831  BP: (!) 126/59  Pulse: (!) 47  Resp: 16  Temp: 98.4 F (36.9 C)  TempSrc: Temporal  SpO2: 100%  Weight: 165 lb (74.8 kg)  Height: '5\' 8"'$  (1.727 m)   Awake alert and oriented Right leg  varicosities marked with the patient standing and greater saphenous vein which exits the fascia in the mid thigh was marked with the patient supine using ultrasound  PROCEDURE: Right greater saphenous vein ablation totaling 11centimeters and right lower extremity stab phlebectomy greater than 20  TECHNIQUE: Mr. Loel Ro was sterilely prepped and draped in the right lower extremity in the usual fashion and a timeout was called.  Saphenous vein was visualized with ultrasound throughout the upper thigh from the mid thigh to the groin.  The area in the mid thigh was anesthetized and the vein was cannulated with a micropuncture needle followed by wire and sheath.  We then placed a J-wire to 3 cm from the saphenofemoral junction and then a 45 cm laser sheath was placed there as well under direct ultrasound visualization.  The J-wire was noted to straighten.  Tumescent anesthesia was administered along the path of the great saphenous vein and this was then ablated for a total of 11 cm from 3 cm from the saphenofemoral junction back to 2 cm from the entrance of the saphenous vein.  At completion the common femoral vein was noted to be compressible.  Attention was then turned towards stab phlebectomy initially of the great saphenous vein throughout the rest of the thigh down to the lower leg and then for multiple areas of  varicosities that had previously been marked on the right medial calf and ankle.  These areas were all anesthetized with lidocaine followed by tumescent anesthesia and then were grasped with hook and hemostat and all removed.  This was greater than 20 incisions to remove all of these veins.  After that a sterile compressive dressing was applied.  Patient tolerated procedure well without any complication.  Plan will be for follow-up in 2 weeks with right lower extremity rule out DVT study.  Servando Snare [facility]

## 2022-06-04 NOTE — Progress Notes (Signed)
     Laser Ablation Procedure    Date: 06/04/2022   Jose Dyer DOB:[birth year]  Consent signed: Yes      Surgeon: Servando Snare MD   Procedure: Laser Ablation: right Greater Saphenous Vein  BP (!) 126/59 (BP Location: Left Arm, Patient Position: Sitting, Cuff Size: Normal)   Pulse (!) 47   Temp 98.4 F (36.9 C) (Temporal)   Resp 16   Ht '5\' 8"'$  (1.727 m)   Wt 165 lb (74.8 kg)   SpO2 100%   BMI 25.09 kg/m   Tumescent Anesthesia: 500 cc 0.9% NaCl with 50 cc Lidocaine HCL 1%  and 15 cc 8.4% NaHCO3  Local Anesthesia: 40 cc Lidocaine HCL and NaHCO3 (ratio 2:1)  7 watts continuous mode     Total energy: 559 Joules     Total time: 79 seconds  Treatment Length 11 cm   Laser Fiber Ref. #  [id number]  Lot #  [id number]   Stab Phlebectomy: >20  Sites: Thigh, Calf, and Ankle  Patient tolerated procedure well  Notes:   All staff members wore facial masks.   Description of Procedure:  After marking the course of the secondary varicosities, the patient was placed on the operating table in the supine position, and the right leg was prepped and draped in sterile fashion.   Local anesthetic was administered and under ultrasound guidance the saphenous vein was accessed with a micro needle and guide wire; then the mirco puncture sheath was placed.  A guide wire was inserted saphenofemoral junction , followed by a 5 french sheath.  The position of the sheath and then the laser fiber below the junction was confirmed using the ultrasound.  Tumescent anesthesia was administered along the course of the saphenous vein using ultrasound guidance. The patient was placed in Trendelenburg position and protective laser glasses were placed on patient and staff, and the laser was fired at 7 watts continuous mode for a total of 559 joules.   For stab phlebectomies, local anesthetic was administered at the previously marked varicosities, and tumescent anesthesia was administered around the vessels.  Greater  than 20 stab wounds were made using the tip of an 11 blade. And using the vein hook, the phlebectomies were performed using a hemostat to avulse the varicosities.  Adequate hemostasis was achieved.     Steri strips were applied to the stab wounds and ABD pads and thigh high compression stockings were applied.  Ace wrap bandages were applied over the phlebectomy sites and at the top of the saphenofemoral junction. Blood loss was less than 15 cc.  Discharge instructions reviewed with patient and hardcopy of discharge instructions given to patient to take home. The patient ambulated out of the operating room having tolerated the procedure well.

## 2022-06-08 ENCOUNTER — Encounter: Payer: Self-pay | Admitting: Internal Medicine

## 2022-06-12 ENCOUNTER — Telehealth: Payer: Self-pay

## 2022-06-12 ENCOUNTER — Ambulatory Visit (HOSPITAL_COMMUNITY)
Admission: RE | Admit: 2022-06-12 | Discharge: 2022-06-12 | Disposition: A | Discharge status: Home / Self Care | Payer: Medicare Other | Source: Ambulatory Visit | Attending: Vascular Surgery | Admitting: Vascular Surgery

## 2022-06-12 ENCOUNTER — Encounter: Payer: Self-pay | Admitting: Vascular Surgery

## 2022-06-12 ENCOUNTER — Ambulatory Visit (INDEPENDENT_AMBULATORY_CARE_PROVIDER_SITE_OTHER): Payer: Medicare Other | Admitting: Vascular Surgery

## 2022-06-12 VITALS — BP 161/73 | HR 47 | Temp 98.5°F | Resp 20 | Ht 68.0 in | Wt 172.0 lb

## 2022-06-12 DIAGNOSIS — I824Y2 Acute embolism and thrombosis of unspecified deep veins of left proximal lower extremity: Secondary | ICD-10-CM

## 2022-06-12 DIAGNOSIS — I872 Venous insufficiency (chronic) (peripheral): Secondary | ICD-10-CM

## 2022-06-12 NOTE — Progress Notes (Signed)
Patient seen and examined today status post left greater saphenous vein ablation with Dr. Donzetta Matters.  Patient noticed pain in the left medial calf yesterday while playing golf.  He walks 18 holes daily, and was on #6 when this happened.  He described numbness on the medial aspect the calf as well as in the posterior thigh.  This is accompanied by calf swelling.  On exam, patient had a palpable dorsalis pedis and posterior tibial pulse Significant swelling appreciated Pain to palpation of the calf  Imaging demonstrates no EHIT/DVT  I had a long discussion with Jose Dyer regarding his symptoms.  With the distribution of numbness, I think it is likely saphenous nerve irritation that is leading to medial calf numbness.  No concern for arterial insufficiency, no concern for DVT. I asked that he take it easy this weekend, elevate and compress the left lower extremity.  He will keep his follow-up with Dr. Donzetta Matters next week. He was asked to call should symptoms worsen.  Jose John MD

## 2022-06-12 NOTE — Telephone Encounter (Signed)
Pt called stating that he was unable to put weight on his L leg d/t severe pain. He debated going to the ED, but called this office instead.  Reviewed pt's chart, returned call for clarification, two identifiers used. He c/o 10/10 pain with activity and ambulation. His LLE is tight, swollen, and numb. He denies any red or hot to touch areas. He has been wearing compressions, but admits that the leg feels better without it. He has had no changes in his daily life. Spoke to PA and MD who advised he be seen with DVT study. Appts scheduled. Confirmed understanding.

## 2022-06-18 ENCOUNTER — Encounter: Payer: Self-pay | Admitting: Vascular Surgery

## 2022-06-18 ENCOUNTER — Ambulatory Visit (HOSPITAL_COMMUNITY)
Admission: RE | Admit: 2022-06-18 | Discharge: 2022-06-18 | Disposition: A | Discharge status: Home / Self Care | Payer: Medicare Other | Source: Ambulatory Visit | Attending: Vascular Surgery | Admitting: Vascular Surgery

## 2022-06-18 ENCOUNTER — Ambulatory Visit (INDEPENDENT_AMBULATORY_CARE_PROVIDER_SITE_OTHER): Payer: Medicare Other | Admitting: Vascular Surgery

## 2022-06-18 VITALS — BP 141/70 | HR 48 | Temp 98.2°F | Resp 16 | Ht 68.0 in | Wt 172.0 lb

## 2022-06-18 DIAGNOSIS — I872 Venous insufficiency (chronic) (peripheral): Secondary | ICD-10-CM

## 2022-06-18 DIAGNOSIS — I83891 Varicose veins of right lower extremities with other complications: Secondary | ICD-10-CM | POA: Insufficient documentation

## 2022-06-18 NOTE — Progress Notes (Signed)
Subjective:     Patient ID: Jose Dyer, male   DOB: 30-Sep-1950, 71 y.o.   MRN: [id number]  HPI 71 year old male status post bilateral great saphenous vein ablation and stab phlebectomy greater than 20.  His right leg actually feels fine his left leg was having pain started last week which was over 3 weeks from the first procedure.  States that he still having a little bit of pain but it is eased up he was able to walk 18 holes of golf yesterday.  Does have minimal swelling of the left lower extremity.  He has had some allergic reaction to the thigh-high stockings he is currently wearing knee-high socks.  He has plans for long trip in 3 weeks from today.   Review of Systems Left leg pain, right leg mild pain medially at site of stab phlebectomy    Objective:   Physical Exam Vitals:   06/18/22 1020  BP: (!) 141/70  Pulse: (!) 48  Resp: 16  Temp: 98.2 F (36.8 C)  SpO2: 98%   Awake alert oriented Nonlabored respirations Left calf with mild tenderness palpation with minimal swelling without pitting edema at the ankle Right lower extremity has Steri-Strips in place there is no evidence of edema or bruising  Venous Reflux Times  +--------------+--------+------+----------+------------+-------------------  ----+  RIGHT        Reflux  Reflux  Reflux  Diameter cmsComments                                No       Yes     Time                                         +--------------+--------+------+----------+------------+-------------------  ----+  CFV                   yes  >1 second                                       +--------------+--------+------+----------+------------+-------------------  ----+  FV prox                yes  >1 second                                       +--------------+--------+------+----------+------------+-------------------  ----+  FV mid                 yes  >1 second                                        +--------------+--------+------+----------+------------+-------------------  ----+  FV dist                yes  >1 second                                       +--------------+--------+------+----------+------------+-------------------  ----+  Popliteal    no                                                            +--------------+--------+------+----------+------------+-------------------  ----+  GSV at [facility]    no                          0.49    prior                                                                       ablation/stripping        +--------------+--------+------+----------+------------+-------------------  ----+  GSV prox thigh                                    prior                                                                       ablation/stripping        +--------------+--------+------+----------+------------+-------------------  ----+  GSV mid thigh                                     prior                                                                       ablation/stripping        +--------------+--------+------+----------+------------+-------------------  ----+  GSV dist thigh                                    prior                                                                       ablation/stripping        +--------------+--------+------+----------+------------+-------------------  ----+  GSV at knee                                       prior                                                                       ablation/stripping        +--------------+--------+------+----------+------------+-------------------  ----+  GSV prox calf                                     prior                                                                       ablation/stripping         +--------------+--------+------+----------+------------+-------------------  ----+  SSV Pop Fossa no                          0.13                              +--------------+--------+------+----------+------------+-------------------  ----+  SSV prox calf no                          0.25                              +--------------+--------+------+----------+------------+-------------------  ----+      Summary:  Right:  - No evidence of deep vein thrombosis seen in the right lower extremity,  from the common femoral through the popliteal veins.  - No evidence of superficial venous reflux seen in the right short  saphenous vein.  - Venous reflux is noted in the right common femoral vein.  - Venous reflux is noted in the right femoral vein.     Assessment:     71 year old male status post bilateral great saphenous vein ablation and stab phlebectomy the most recent of which was on the right where he is asymptomatic.  Unfortunately having some pain on the left side DVT study was negative.    Plan:     Continue the knee-high compression socks which she can tolerate  We have discussed walking intermittently when he is on his upcoming long trip  Follow-up with me on an as-needed basis    Janelle Culton C. Donzetta Matters, MD [facility] [place] Office: [phone] Pager: [phone]

## 2022-07-12 ENCOUNTER — Other Ambulatory Visit: Payer: Self-pay | Admitting: Cardiology

## 2022-07-12 DIAGNOSIS — I10 Essential (primary) hypertension: Secondary | ICD-10-CM

## 2022-09-22 ENCOUNTER — Other Ambulatory Visit: Payer: Self-pay | Admitting: Cardiology

## 2022-09-24 DIAGNOSIS — H35033 Hypertensive retinopathy, bilateral: Secondary | ICD-10-CM | POA: Diagnosis not present

## 2022-09-24 DIAGNOSIS — H35342 Macular cyst, hole, or pseudohole, left eye: Secondary | ICD-10-CM | POA: Diagnosis not present

## 2022-09-24 DIAGNOSIS — H43811 Vitreous degeneration, right eye: Secondary | ICD-10-CM | POA: Diagnosis not present

## 2022-10-20 DIAGNOSIS — Z947 Corneal transplant status: Secondary | ICD-10-CM | POA: Diagnosis not present

## 2022-10-29 DIAGNOSIS — H1711 Central corneal opacity, right eye: Secondary | ICD-10-CM | POA: Diagnosis not present

## 2022-12-19 ENCOUNTER — Other Ambulatory Visit: Payer: Self-pay | Admitting: Cardiology

## 2023-01-18 ENCOUNTER — Ambulatory Visit: Payer: Medicare Other | Admitting: Cardiology

## 2023-01-18 ENCOUNTER — Encounter: Payer: Self-pay | Admitting: Cardiology

## 2023-01-18 VITALS — BP 157/73 | HR 47 | Resp 16 | Ht 68.0 in | Wt 170.0 lb

## 2023-01-18 DIAGNOSIS — E7800 Pure hypercholesterolemia, unspecified: Secondary | ICD-10-CM | POA: Diagnosis not present

## 2023-01-18 DIAGNOSIS — G720 Drug-induced myopathy: Secondary | ICD-10-CM

## 2023-01-18 DIAGNOSIS — I351 Nonrheumatic aortic (valve) insufficiency: Secondary | ICD-10-CM | POA: Diagnosis not present

## 2023-01-18 DIAGNOSIS — I10 Essential (primary) hypertension: Secondary | ICD-10-CM | POA: Diagnosis not present

## 2023-01-18 DIAGNOSIS — R1900 Intra-abdominal and pelvic swelling, mass and lump, unspecified site: Secondary | ICD-10-CM

## 2023-01-18 DIAGNOSIS — T466X5A Adverse effect of antihyperlipidemic and antiarteriosclerotic drugs, initial encounter: Secondary | ICD-10-CM | POA: Diagnosis not present

## 2023-01-18 DIAGNOSIS — I25118 Atherosclerotic heart disease of native coronary artery with other forms of angina pectoris: Secondary | ICD-10-CM | POA: Diagnosis not present

## 2023-01-18 NOTE — Progress Notes (Signed)
Primary Physician/Referring:  Ronnald Nian, MD  Patient ID: Jose Dyer, male    DOB: 08-07-1951, 72 y.o.   MRN: [id number]  Chief Complaint  Patient presents with   Coronary Artery Disease   Follow-up   HPI:    Jose Dyer  is a 72 y.o. male patient with history of stroke without residual defects 2015, CAD with CTO LAD S/P revascularization of the LAD on 01/27/2017 by retrograde access. He had undergone successful revascularization and stenting of the circumflex coronary artery on 12/14/2016 which is widely patent.  Also has bilateral GSV ablation.  He is asymptomatic. He continues to remain very active and walks anywhere from 8 to 10 miles a day.     Past Medical History:  Diagnosis Date   Anginal pain (HCC)    Corneal scarring    Coronary artery disease    Dyslipidemia    FHx: cardiovascular disease    GERD (gastroesophageal reflux disease)    History of hiatal hernia    History of kidney stones    "none since the 1990s" (01/27/2017)   Hypertension    Stroke (HCC) 2014   denies residual on 01/27/2017   Social History   Tobacco Use   Smoking status: Never    Passive exposure: Never   Smokeless tobacco: Never  Substance Use Topics   Alcohol use: No   Marital Status: Single  ROS  Review of Systems  Cardiovascular:  Negative for chest pain, dyspnea on exertion and leg swelling.  Gastrointestinal:  Negative for melena.   Objective  Blood pressure (!) 157/73, pulse (!) 47, resp. rate 16, height 5\' 8"  (1.727 m), weight 170 lb (77.1 kg), SpO2 97 %. Body mass index is 25.85 kg/m.     01/18/2023    2:52 PM 06/18/2022   10:20 AM 06/12/2022   10:57 AM  Vitals with BMI  Height 5\' 8"  5\' 8"  5\' 8"   Weight 170 lbs 172 lbs 172 lbs  BMI 25.85 26.16 26.16  Systolic 157 141 132  Diastolic 73 70 73  Pulse 47 48 47     Physical Exam Neck:     Vascular: No carotid bruit or JVD.  Cardiovascular:     Rate and Rhythm: Normal rate and regular rhythm.     Pulses: Intact distal  pulses.     Heart sounds: Murmur heard.     Midsystolic murmur is present with a grade of 2/6 at the upper right sternal border radiating to the apex.     No gallop.  Pulmonary:     Effort: Pulmonary effort is normal.     Breath sounds: Normal breath sounds.  Abdominal:     General: Bowel sounds are normal.     Palpations: Abdomen is soft. There is pulsatile mass.  Musculoskeletal:     Right lower leg: No edema.     Left lower leg: No edema.     Laboratory examination:   Lab Results  Component Value Date   NA 143 01/07/2022   K 4.4 01/07/2022   CO2 25 01/07/2022   GLUCOSE 102 (H) 01/07/2022   BUN 22 01/07/2022   CREATININE 0.89 01/07/2022   CALCIUM 9.1 01/07/2022   EGFR 90 01/07/2022   GFRNONAA >60 09/04/2018       Latest Ref Rng & Units 01/07/2022    9:18 AM 12/30/2020    1:12 PM 09/04/2018    3:55 AM  CMP  Glucose 70 - 99 mg/dL 440  94  102  BUN 8 - 27 mg/dL 22  23  25    Creatinine 0.76 - 1.27 mg/dL 1.61  0.96  0.45   Sodium 134 - 144 mmol/L 143  142  139   Potassium 3.5 - 5.2 mmol/L 4.4  4.8  4.0   Chloride 96 - 106 mmol/L 105  103  103   CO2 20 - 29 mmol/L 25  24  24    Calcium 8.6 - 10.2 mg/dL 9.1  9.7  9.5   Total Protein 6.0 - 8.5 g/dL 6.9  7.3  6.6   Total Bilirubin 0.0 - 1.2 mg/dL 0.4  0.5  0.6   Alkaline Phos 44 - 121 IU/L 74  73  53   AST 0 - 40 IU/L 19  19  21    ALT 0 - 44 IU/L 15  16  22        Latest Ref Rng & Units 01/07/2022    9:18 AM 12/30/2020    1:14 PM 09/04/2018    3:55 AM  CBC  WBC 3.4 - 10.8 x10E3/uL 8.3  6.6  11.1   Hemoglobin 13.0 - 17.7 g/dL 40.9  81.1  91.4   Hematocrit 37.5 - 51.0 % 41.7  42.0  44.7   Platelets 150 - 450 x10E3/uL 208  201  244    Lab Results  Component Value Date   CHOL 138 01/07/2022   HDL 45 01/07/2022   LDLCALC 79 01/07/2022   LDLDIRECT 53 12/30/2020   TRIG 67 01/07/2022   CHOLHDL 4.3 09/22/2016    HEMOGLOBIN A1C Lab Results  Component Value Date   HGBA1C 5.4 01/07/2022   MPG 111 01/30/2014   No  results found for: "TSH"   Radiology:   No results found.  Cardiac Studies:   Coronary angiogram by Dr. Eldridge Dace 01/27/2017: CTO Prox LAD to Mid LAD lesion, 100 %stenosed, right to left collaterals.Successful CTO PCI with a SYNERGY DES [id number] drug eluting stent which was successfully placed, postdilated to > 4 mm. Distal Cx 3.0x20 mm Synergy DES placed 12/14/2016 widely patent. Normal LVEF.   PCV ECHOCARDIOGRAM COMPLETE 01/15/2022  Narrative Echocardiogram 01/15/2022: Left ventricle cavity is normal in size and wall thickness. Normal global wall motion. Normal LV systolic function with EF 67%. Doppler evidence of grade I (impaired) diastolic dysfunction, normal LAP. Left atrial cavity is moderately dilated. Aneurysmal interatrial septum without 2D or color Doppler evidence of shunting. Structurally normal trileaflet aortic valve.  Moderate (Grade II) aortic regurgitation. Mild to moderate mitral regurgitation. Mild to moderate tricuspid regurgitation. Estimated pulmonary artery systolic pressure 28 mmHg. Previous study in 2018 noted mod RA dilatation and mild PH with estimated PASP 40 mmHg not appreciated on this study.    Carotid artery duplex 01/15/2022: Duplex suggests stenosis in the right internal carotid artery (minimal). Duplex suggests stenosis in the right external carotid artery (<50%). Duplex suggests stenosis in the left internal carotid artery (minimal). Antegrade right vertebral artery flow. Antegrade left vertebral artery flow. Right external carotid artery stenosis may be the source of bruit. Follow up is appropriate if clinically indicated  EKG:   EKG 01/18/2023: Marked sinus bradycardia at the rate of 48 bpm, early repolarization, otherwise normal EKG.  No change from 01/07/2022.   Medications and allergies   Allergies  Allergen Reactions   Statins Other (See Comments)    Muscle tightening    Current Outpatient Medications:    amLODipine (NORVASC) 5 MG tablet,  TAKE 1 TABLET BY MOUTH IN THE  EVENING AFTER DINNER, Disp:  100 tablet, Rfl: 2   aspirin 81 MG tablet, Take 81 mg by mouth daily., Disp: , Rfl:    Evolocumab (REPATHA) 140 MG/ML SOSY, Inject 1 pen into the skin every 30 (thirty) days., Disp: 6.3 mL, Rfl: 3   ezetimibe (ZETIA) 10 MG tablet, Take 1 tablet (10 mg total) by mouth daily., Disp: 90 tablet, Rfl: 3   Multiple Vitamin (MULTI-VITAMIN) tablet, Take 1 tablet by mouth daily., Disp: , Rfl:    nitroGLYCERIN (NITROSTAT) 0.4 MG SL tablet, Place 1 tablet (0.4 mg total) under the tongue every 5 (five) minutes as needed for chest pain., Disp: 25 tablet, Rfl: 12   pantoprazole (PROTONIX) 40 MG tablet, TAKE 1 TABLET BY MOUTH DAILY, Disp: 100 tablet, Rfl: 2   prednisoLONE acetate (PRED FORTE) 1 % ophthalmic suspension, Place 1 drop into the right eye in the morning and at bedtime., Disp: , Rfl:    Assessment     ICD-10-CM   1. Atherosclerosis of native coronary artery of native heart with stable angina pectoris (HCC)  I25.118 EKG 12-Lead    2. Moderate aortic regurgitation  I35.1     3. Primary hypertension  I10 CBC    PSA    COMPLETE METABOLIC PANEL WITH GFR    TSH    4. White coat syndrome with diagnosis of hypertension  I10     5. Statin myopathy  G72.0    T46.6X5A     6. Hypercholesteremia  E78.00 Lipid Panel With LDL/HDL Ratio    7. Pulsatile abdominal mass  R19.00 PCV AORTA DUPLEX      Medications Discontinued During This Encounter  Medication Reason   LORazepam (ATIVAN) 1 MG tablet    REPATHA SURECLICK 140 MG/ML SOAJ      No orders of the defined types were placed in this encounter.  Orders Placed This Encounter  Procedures   CBC   Lipid Panel With LDL/HDL Ratio   PSA   COMPLETE METABOLIC PANEL WITH GFR   TSH   EKG 12-Lead   Recommendations:   Migel Trible is a 72 y.o. male patient with history of stroke without residual defects 2015, CAD with CTO LAD S/P revascularization of the LAD on 01/27/2017 by retrograde  access. He had undergone successful revascularization and stenting of the circumflex coronary artery on 12/14/2016 which is widely patent.  Also has bilateral GSV ablation.  1. Atherosclerosis of native coronary artery of native heart with stable angina pectoris [facility]) Patient has not had any significant chest discomfort, in fact he has not had any anginal symptoms over the past 1 year.  He has not used any sublingual nitroglycerin.  No change in the EKG. - EKG 12-Lead  2. Moderate aortic regurgitation He has aortic sclerotic murmur but aortic regurgitation murmur is not well-appreciated.  Echocardiogram does reveal moderate aortic regurgitation and mild to moderate MR however clinically he is doing well and remains asymptomatic and continues to exercise on a regular basis and also walks 18 holes of golf without any chest pain or dyspnea.  He also exercises on the treadmill.  3. Primary hypertension Markedly elevated blood pressure upon presentation, home blood pressure recordings under excellent control.  Patient has whitecoat hypertension. - CBC - PSA - COMPLETE METABOLIC PANEL WITH GFR - TSH  4. White coat syndrome with diagnosis of hypertension  5. Statin myopathy Patient is presently on PCSK9 inhibitors once a month, lipids have been well-controlled, will repeat lipid profile testing.  6. Hypercholesteremia  - Lipid Panel With  LDL/HDL Ratio  7. Pulsatile abdominal mass I ordered abdominal duplex to exclude AAA. - PCV AORTA DUPLEX; Future  Patient requested that I ordered labs including PSA, this has been ordered, will make sure that Dr. Susann Givens does get a copy.     Yates Decamp, MD, [facility] 01/18/2023, 3:25 PM Office: [phone]

## 2023-01-20 DIAGNOSIS — I10 Essential (primary) hypertension: Secondary | ICD-10-CM | POA: Diagnosis not present

## 2023-01-20 DIAGNOSIS — E7800 Pure hypercholesterolemia, unspecified: Secondary | ICD-10-CM | POA: Diagnosis not present

## 2023-01-21 LAB — LIPID PANEL WITH LDL/HDL RATIO
Cholesterol, Total: 107 mg/dL (ref 100–199)
HDL: 39 mg/dL — ABNORMAL LOW (ref >39)
LDL Chol Calc (NIH): 53 mg/dL (ref 0–99)
LDL/HDL Ratio: 1.4 ratio (ref 0.0–3.6)
Triglycerides: 70 mg/dL (ref 0–149)
VLDL Cholesterol Cal: 15 mg/dL (ref 5–40)

## 2023-01-21 LAB — CBC
Hematocrit: 40.1 % (ref 37.5–51.0)
Hemoglobin: 13.6 g/dL (ref 13.0–17.7)
MCH: 30 pg (ref 26.6–33.0)
MCHC: 33.9 g/dL (ref 31.5–35.7)
MCV: 89 fL (ref 79–97)
Platelets: 189 10*3/uL (ref 150–450)
RBC: 4.53 x10E6/uL (ref 4.14–5.80)
RDW: 12.6 % (ref 11.6–15.4)
WBC: 6.2 10*3/uL (ref 3.4–10.8)

## 2023-01-21 LAB — TSH: TSH: 0.505 u[IU]/mL (ref 0.450–4.500)

## 2023-01-21 LAB — PSA: Prostate Specific Ag, Serum: 3.1 ng/mL (ref 0.0–4.0)

## 2023-01-21 NOTE — Progress Notes (Signed)
Labs normal. Done at his request including PSA

## 2023-02-17 ENCOUNTER — Other Ambulatory Visit: Payer: Self-pay | Admitting: Cardiology

## 2023-02-17 ENCOUNTER — Other Ambulatory Visit: Payer: Medicare Other

## 2023-02-17 DIAGNOSIS — E7800 Pure hypercholesterolemia, unspecified: Secondary | ICD-10-CM

## 2023-03-25 ENCOUNTER — Other Ambulatory Visit: Payer: Medicare Other

## 2023-03-30 ENCOUNTER — Other Ambulatory Visit: Payer: Self-pay | Admitting: Cardiology

## 2023-03-30 DIAGNOSIS — I10 Essential (primary) hypertension: Secondary | ICD-10-CM

## 2023-05-20 ENCOUNTER — Ambulatory Visit: Payer: Medicare Other | Admitting: Family Medicine

## 2023-05-26 ENCOUNTER — Encounter: Payer: Self-pay | Admitting: Gastroenterology

## 2023-06-01 ENCOUNTER — Encounter: Payer: Self-pay | Admitting: Family Medicine

## 2023-06-01 ENCOUNTER — Ambulatory Visit: Payer: Medicare Other | Admitting: Family Medicine

## 2023-06-01 VITALS — BP 138/70 | HR 46 | Ht 67.5 in | Wt 167.0 lb

## 2023-06-01 DIAGNOSIS — H35033 Hypertensive retinopathy, bilateral: Secondary | ICD-10-CM | POA: Diagnosis not present

## 2023-06-01 DIAGNOSIS — I10 Essential (primary) hypertension: Secondary | ICD-10-CM | POA: Diagnosis not present

## 2023-06-01 DIAGNOSIS — Z9861 Coronary angioplasty status: Secondary | ICD-10-CM

## 2023-06-01 DIAGNOSIS — E785 Hyperlipidemia, unspecified: Secondary | ICD-10-CM

## 2023-06-01 DIAGNOSIS — R972 Elevated prostate specific antigen [PSA]: Secondary | ICD-10-CM

## 2023-06-01 DIAGNOSIS — Z947 Corneal transplant status: Secondary | ICD-10-CM

## 2023-06-01 DIAGNOSIS — Z0000 Encounter for general adult medical examination without abnormal findings: Secondary | ICD-10-CM | POA: Diagnosis not present

## 2023-06-01 DIAGNOSIS — Z23 Encounter for immunization: Secondary | ICD-10-CM

## 2023-06-01 DIAGNOSIS — K219 Gastro-esophageal reflux disease without esophagitis: Secondary | ICD-10-CM

## 2023-06-01 DIAGNOSIS — Z789 Other specified health status: Secondary | ICD-10-CM | POA: Diagnosis not present

## 2023-06-01 DIAGNOSIS — Z9849 Cataract extraction status, unspecified eye: Secondary | ICD-10-CM | POA: Diagnosis not present

## 2023-06-01 MED ORDER — PANTOPRAZOLE SODIUM 40 MG PO TBEC: 40.0000 mg | DELAYED_RELEASE_TABLET | Freq: Every day | ORAL | 2 refills | Status: DC

## 2023-06-01 NOTE — Progress Notes (Signed)
Complete physical exam  Patient: Jose Dyer   DOB: April 28, 1951   72 y.o. Male  MRN: [id number]  Subjective:    Chief Complaint  Patient presents with   Annual Exam    Fasting annual exam. No concerns. Has not yet has AWV with nurse, last was 05/15/2022-needs to schedule.    Jose Dyer is a 72 y.o. male who presents today for an annual wellness visit and complete physical exam. He reports consuming a general diet.  Golfs 7 days a week.   He generally feels well. He reports sleeping well. He is followed regularly by cardiology and exercises regularly with golf.  No chest pain, shortness breath, PND.  He continues on Zetia and Norvasc.  He is also getting Repatha.  He has had his COVID and flu shots.  He is scheduled for repeat colonoscopy.  He does have reflux disease and is using his medicine every other day with good results.  Most recent fall risk assessment:    06/01/2023   10:29 AM  Fall Risk   Falls in the past year? 0  Number falls in past yr: 0  Injury with Fall? 0  Risk for fall due to : No Fall Risks  Follow up Falls evaluation completed     Most recent depression screenings:    06/01/2023   10:29 AM 05/15/2022    2:31 PM  PHQ 2/9 Scores  PHQ - 2 Score 0 0  PHQ- 9 Score  0    Vision:Within last year he does see ophthalmology regularly.    Patient Care Team: Ronnald Nian, MD as PCP - General (Family Medicine)   Outpatient Medications Prior to Visit  Medication Sig Note   amLODipine (NORVASC) 5 MG tablet TAKE 1 TABLET BY MOUTH IN THE  EVENING AFTER DINNER    aspirin 81 MG tablet Take 81 mg by mouth daily.    Evolocumab (REPATHA) 140 MG/ML SOSY Inject 1 pen into the skin every 30 (thirty) days.    ezetimibe (ZETIA) 10 MG tablet TAKE 1 TABLET BY MOUTH DAILY    Multiple Vitamin (MULTI-VITAMIN) tablet Take 1 tablet by mouth daily.    prednisoLONE acetate (PRED FORTE) 1 % ophthalmic suspension Place 1 drop into the right eye in the morning and at bedtime.     nitroGLYCERIN (NITROSTAT) 0.4 MG SL tablet Place 1 tablet (0.4 mg total) under the tongue every 5 (five) minutes as needed for chest pain. (Patient not taking: Reported on 06/01/2023) 06/01/2023: As needed   [DISCONTINUED] pantoprazole (PROTONIX) 40 MG tablet TAKE 1 TABLET BY MOUTH DAILY    No facility-administered medications prior to visit.    Review of Systems  All other systems reviewed and are negative. Family and social history as well as health maintenance and immunizations was reviewed as above.        Objective:     BP 138/70   Pulse (!) 46   Ht 5' 7.5" (1.715 m)   Wt 167 lb (75.8 kg)   BMI 25.77 kg/m    Physical Exam  Alert and in no distress. Tympanic membranes and canals are normal. Pharyngeal area is normal. Neck is supple without adenopathy or thyromegaly. Cardiac exam shows a regular sinus rhythm without murmurs or gallops. Lungs are clear to auscultation.      Assessment & Plan:    Routine general medical examination at a health care facility - Plan: CBC with Differential/Platelet, Comprehensive metabolic panel, Lipid panel  Status post corneal transplant  Statin intolerance  Post PTCA  Hypertensive retinopathy of both eyes - Plan: Comprehensive metabolic panel  Primary hypertension  Hyperlipidemia LDL goal <100 - Plan: Lipid panel  History of cataract extraction, unspecified laterality  Gastroesophageal reflux disease without esophagitis - Plan: pantoprazole (PROTONIX) 40 MG tablet  Immunization History  Administered Date(s) Administered   Fluad Quad(high Dose 65+) 05/13/2021, 05/18/2022, 04/27/2023   Influenza, High Dose Seasonal PF 05/23/2014   Influenza-Unspecified 06/15/2015, 06/30/2016   PFIZER(Purple Top)SARS-COV-2 Vaccination 09/21/2019, 10/16/2019, 05/11/2020, 12/24/2020   Pfizer Covid-19 Vaccine Bivalent Booster 9yrs & up 05/13/2021   Pfizer(Comirnaty)Fall Seasonal Vaccine 12 years and older 04/27/2023   Pneumococcal Conjugate-13  06/28/2014   Pneumococcal Polysaccharide-23 12/16/2015   Tdap 09/26/2008   Zoster Recombinant(Shingrix) 07/01/2021, 01/29/2022   Zoster, Live 11/16/2011    Health Maintenance  Topic Date Due   DTaP/Tdap/Td (2 - Td or Tdap) 09/26/2018   Colonoscopy  06/08/2023   COVID-19 Vaccine (7 - 2023-24 season) 06/22/2023   Medicare Annual Wellness (AWV)  05/31/2024   Pneumonia Vaccine 57+ Years old  Completed   INFLUENZA VACCINE  Completed   Hepatitis C Screening  Completed   Zoster Vaccines- Shingrix  Completed   HPV VACCINES  Aged Out    Discussed health benefits of physical activity, he will continue on his present medication regimen.  He will follow-up with ophthalmology as well as cardiology. Problem List Items Addressed This Visit     Gastroesophageal reflux disease without esophagitis   Relevant Medications   pantoprazole (PROTONIX) 40 MG tablet   History of cataract extraction   Hyperlipidemia LDL goal <100   Relevant Orders   Lipid panel   Hypertension   Hypertensive retinopathy of both eyes   Relevant Orders   Comprehensive metabolic panel   Post PTCA   Statin intolerance   Status post corneal transplant   Other Visit Diagnoses     Routine general medical examination at a health care facility    -  Primary   Relevant Orders   CBC with Differential/Platelet   Comprehensive metabolic panel   Lipid panel      No follow-ups on file.     Sharlot Gowda, MD

## 2023-06-02 LAB — CBC WITH DIFFERENTIAL/PLATELET
Basophils Absolute: 0 10*3/uL (ref 0.0–0.2)
Basos: 1 %
EOS (ABSOLUTE): 0.2 10*3/uL (ref 0.0–0.4)
Eos: 3 %
Hematocrit: 43.2 % (ref 37.5–51.0)
Hemoglobin: 14 g/dL (ref 13.0–17.7)
Immature Grans (Abs): 0 10*3/uL (ref 0.0–0.1)
Immature Granulocytes: 0 %
Lymphocytes Absolute: 1.9 10*3/uL (ref 0.7–3.1)
Lymphs: 30 %
MCH: 29.2 pg (ref 26.6–33.0)
MCHC: 32.4 g/dL (ref 31.5–35.7)
MCV: 90 fL (ref 79–97)
Monocytes Absolute: 0.7 10*3/uL (ref 0.1–0.9)
Monocytes: 11 %
Neutrophils Absolute: 3.5 10*3/uL (ref 1.4–7.0)
Neutrophils: 55 %
Platelets: 214 10*3/uL (ref 150–450)
RBC: 4.8 x10E6/uL (ref 4.14–5.80)
RDW: 12.3 % (ref 11.6–15.4)
WBC: 6.3 10*3/uL (ref 3.4–10.8)

## 2023-06-02 LAB — LIPID PANEL
Cholesterol, Total: 76 mg/dL — ABNORMAL LOW (ref 100–199)
HDL: 34 mg/dL — ABNORMAL LOW (ref >39)
LDL CALC COMMENT:: 2.2 ratio (ref 0.0–5.0)
LDL Chol Calc (NIH): 24 mg/dL (ref 0–99)
Triglycerides: 92 mg/dL (ref 0–149)
VLDL Cholesterol Cal: 18 mg/dL (ref 5–40)

## 2023-06-02 LAB — COMPREHENSIVE METABOLIC PANEL
ALT: 19 IU/L (ref 0–44)
AST: 19 IU/L (ref 0–40)
Albumin: 4.4 g/dL (ref 3.8–4.8)
Alkaline Phosphatase: 75 IU/L (ref 44–121)
BUN/Creatinine Ratio: 20 (ref 10–24)
BUN: 18 mg/dL (ref 8–27)
Bilirubin Total: 0.3 mg/dL (ref 0.0–1.2)
CO2: 27 mmol/L (ref 20–29)
Calcium: 9.4 mg/dL (ref 8.6–10.2)
Chloride: 102 mmol/L (ref 96–106)
Creatinine, Ser: 0.88 mg/dL (ref 0.76–1.27)
Globulin, Total: 2.5 g/dL (ref 1.5–4.5)
Glucose: 96 mg/dL (ref 70–99)
Potassium: 5.1 mmol/L (ref 3.5–5.2)
Sodium: 140 mmol/L (ref 134–144)
Total Protein: 6.9 g/dL (ref 6.0–8.5)
eGFR: 90 mL/min/{1.73_m2} (ref >59)

## 2023-06-25 ENCOUNTER — Encounter: Payer: Self-pay | Admitting: Gastroenterology

## 2023-07-01 ENCOUNTER — Encounter: Payer: Self-pay | Admitting: Gastroenterology

## 2023-07-01 ENCOUNTER — Other Ambulatory Visit: Payer: Self-pay | Admitting: Cardiology

## 2023-08-03 DIAGNOSIS — D225 Melanocytic nevi of trunk: Secondary | ICD-10-CM | POA: Diagnosis not present

## 2023-08-03 DIAGNOSIS — L814 Other melanin hyperpigmentation: Secondary | ICD-10-CM | POA: Diagnosis not present

## 2023-08-03 DIAGNOSIS — L821 Other seborrheic keratosis: Secondary | ICD-10-CM | POA: Diagnosis not present

## 2023-08-05 ENCOUNTER — Ambulatory Visit: Payer: Medicare Other

## 2023-08-05 VITALS — Ht 68.0 in | Wt 165.0 lb

## 2023-08-05 DIAGNOSIS — R195 Other fecal abnormalities: Secondary | ICD-10-CM

## 2023-08-05 DIAGNOSIS — Z792 Long term (current) use of antibiotics: Secondary | ICD-10-CM

## 2023-08-05 MED ORDER — NA SULFATE-K SULFATE-MG SULF 17.5-3.13-1.6 GM/177ML PO SOLN: 1.0000 | Freq: Once | ORAL | 0 refills | Status: AC

## 2023-08-05 NOTE — Progress Notes (Signed)

## 2023-08-31 ENCOUNTER — Encounter: Payer: Self-pay | Admitting: Gastroenterology

## 2023-09-02 ENCOUNTER — Ambulatory Visit (AMBULATORY_SURGERY_CENTER): Payer: Medicare Other | Admitting: Gastroenterology

## 2023-09-02 ENCOUNTER — Encounter: Payer: Self-pay | Admitting: Gastroenterology

## 2023-09-02 VITALS — BP 122/66 | HR 51 | Temp 98.8°F | Resp 11 | Ht 67.5 in | Wt 165.0 lb

## 2023-09-02 DIAGNOSIS — I10 Essential (primary) hypertension: Secondary | ICD-10-CM | POA: Diagnosis not present

## 2023-09-02 DIAGNOSIS — K5730 Diverticulosis of large intestine without perforation or abscess without bleeding: Secondary | ICD-10-CM

## 2023-09-02 DIAGNOSIS — K635 Polyp of colon: Secondary | ICD-10-CM

## 2023-09-02 DIAGNOSIS — D122 Benign neoplasm of ascending colon: Secondary | ICD-10-CM | POA: Diagnosis not present

## 2023-09-02 DIAGNOSIS — K641 Second degree hemorrhoids: Secondary | ICD-10-CM

## 2023-09-02 DIAGNOSIS — Z1211 Encounter for screening for malignant neoplasm of colon: Secondary | ICD-10-CM | POA: Diagnosis not present

## 2023-09-02 DIAGNOSIS — Z860100 Personal history of colon polyps, unspecified: Secondary | ICD-10-CM

## 2023-09-02 DIAGNOSIS — I2510 Atherosclerotic heart disease of native coronary artery without angina pectoris: Secondary | ICD-10-CM | POA: Diagnosis not present

## 2023-09-02 MED ORDER — SODIUM CHLORIDE 0.9 % IV SOLN: 500.0000 mL | Freq: Once | INTRAVENOUS | Status: DC

## 2023-09-02 NOTE — Op Note (Signed)
[facility] Patient Name: Jose Dyer Procedure Date: 09/02/2023 2:05 PM MRN: [id number] Endoscopist: Doristine Locks , MD, [id number] Age: 73 Referring MD:  Date of Birth: 1950/10/04 Gender: Male Account #: [id number] Procedure:                Colonoscopy Indications:              Surveillance: Personal history of adenomatous                            polyps on last colonoscopy 5 years ago                           Last colonoscopy was 05/2018 and notable for 3                            subcentimeter adenomas removed from the transverse                            colon, diverticulosis, small internal hemorrhoids,                            with recommendation to repeat in 5 years. Medicines:                Monitored Anesthesia Care Procedure:                Pre-Anesthesia Assessment:                           - Prior to the procedure, a History and Physical                            was performed, and patient medications and                            allergies were reviewed. The patient's tolerance of                            previous anesthesia was also reviewed. The risks                            and benefits of the procedure and the sedation                            options and risks were discussed with the patient.                            All questions were answered, and informed consent                            was obtained. Prior Anticoagulants: The patient has                            taken no anticoagulant or antiplatelet agents. ASA  Grade Assessment: II - A patient with mild systemic                            disease. After reviewing the risks and benefits,                            the patient was deemed in satisfactory condition to                            undergo the procedure.                           After obtaining informed consent, the colonoscope                            was passed under direct vision.  Throughout the                            procedure, the patient's blood pressure, pulse, and                            oxygen saturations were monitored continuously. The                            Olympus Scope SN [id number] was introduced through the                            anus and advanced to the the terminal ileum. The                            colonoscopy was performed without difficulty. The                            patient tolerated the procedure well. The quality                            of the bowel preparation was good. The terminal                            ileum, ileocecal valve, appendiceal orifice, and                            rectum were photographed. Scope In: 2:32:06 PM Scope Out: 2:44:00 PM Scope Withdrawal Time: 0 hours 9 minutes 46 seconds  Total Procedure Duration: 0 hours 11 minutes 54 seconds  Findings:                 The perianal and digital rectal examinations were                            normal.                           Three sessile polyps were found in the ascending  colon. The polyps were 2 to 4 mm in size. These                            polyps were removed with a cold snare. Resection                            and retrieval were complete. Estimated blood loss                            was minimal.                           Multiple large-mouthed and small-mouthed                            diverticula were found in the sigmoid colon,                            descending colon, transverse colon, ascending colon                            and cecum.                           Non-bleeding internal hemorrhoids were found during                            retroflexion. The hemorrhoids were small and Grade                            II (internal hemorrhoids that prolapse but reduce                            spontaneously).                           The terminal ileum appeared normal. Complications:            No  immediate complications. Estimated Blood Loss:     Estimated blood loss was minimal. Impression:               - Three 2 to 4 mm polyps in the ascending colon,                            removed with a cold snare. Resected and retrieved.                           - Diverticulosis in the sigmoid colon, in the                            descending colon, in the transverse colon, in the                            ascending colon and in the cecum.                           -  Non-bleeding internal hemorrhoids.                           - The examined portion of the ileum was normal. Recommendation:           - Patient has a contact number available for                            emergencies. The signs and symptoms of potential                            delayed complications were discussed with the                            patient. Return to normal activities tomorrow.                            Written discharge instructions were provided to the                            patient.                           - Resume previous diet.                           - Continue present medications.                           - Await pathology results.                           - Repeat colonoscopy for surveillance based on                            pathology results.                           - Return to GI office PRN. Doristine Locks, MD 09/02/2023 2:50:35 PM

## 2023-09-02 NOTE — Patient Instructions (Signed)
Resume all of your previous medications today as ordered.  Read your discharge isntructions.  YOU HAD AN ENDOSCOPIC PROCEDURE TODAY AT THE [facility]:   Refer to the procedure report that was given to you for any specific questions about what was found during the examination.  If the procedure report does not answer your questions, please call your gastroenterologist to clarify.  If you requested that your care partner not be given the details of your procedure findings, then the procedure report has been included in a sealed envelope for you to review at your convenience later.  YOU SHOULD EXPECT: Some feelings of bloating in the abdomen. Passage of more gas than usual.  Walking can help get rid of the air that was put into your GI tract during the procedure and reduce the bloating. If you had a lower endoscopy (such as a colonoscopy or flexible sigmoidoscopy) you may notice spotting of blood in your stool or on the toilet paper. If you underwent a bowel prep for your procedure, you may not have a normal bowel movement for a few days.  Please Note:  You might notice some irritation and congestion in your nose or some drainage.  This is from the oxygen used during your procedure.  There is no need for concern and it should clear up in a day or so.  SYMPTOMS TO REPORT IMMEDIATELY:  Following lower endoscopy (colonoscopy or flexible sigmoidoscopy):  Excessive amounts of blood in the stool  Significant tenderness or worsening of abdominal pains  Swelling of the abdomen that is new, acute  Fever of 100F or higher   For urgent or emergent issues, a gastroenterologist can be reached at any hour by calling [phone]. Do not use MyChart messaging for urgent concerns.    DIET:  We do recommend a small meal at first, but then you may proceed to your regular diet.  Drink plenty of fluids but you should avoid alcoholic beverages for 24 hours.  ACTIVITY:  You should plan to take it easy  for the rest of today and you should NOT DRIVE or use heavy machinery until tomorrow (because of the sedation medicines used during the test).    FOLLOW UP: Our staff will call the number listed on your records the next business day following your procedure.  We will call around 7:15- 8:00 am to check on you and address any questions or concerns that you may have regarding the information given to you following your procedure. If we do not reach you, we will leave a message.     If any biopsies were taken you will be contacted by phone or by letter within the next 1-3 weeks.  Please call us at [phone] if you have not heard about the biopsies in 3 weeks.    SIGNATURES/CONFIDENTIALITY: You and/or your care partner have signed paperwork which will be entered into your electronic medical record.  These signatures attest to the fact that that the information above on your After Visit Summary has been reviewed and is understood.  Full responsibility of the confidentiality of this discharge information lies with you and/or your care-partner.

## 2023-09-02 NOTE — Progress Notes (Signed)
GASTROENTEROLOGY PROCEDURE H&P NOTE   Primary Care Physician: Ronnald Nian, MD    Reason for Procedure:  Colon polyp surveillance  Plan:    Colonoscopy  Patient is appropriate for endoscopic procedure(s) in the ambulatory ([facility]) setting.  The nature of the procedure, as well as the risks, benefits, and alternatives were carefully and thoroughly reviewed with the patient. Ample time for discussion and questions allowed. The patient understood, was satisfied, and agreed to proceed.     HPI: Jose Dyer is a 73 y.o. male who presents for colonoscopy for ongoing colon polyp surveillance and colon cancer screening.  No active GI symptoms.  No known family history of colon cancer or related malignancy.  Patient is otherwise without complaints or active issues today.  Last colonoscopy was 05/2018 and notable for 3 subcentimeter adenomas removed from the transverse colon, diverticulosis, small internal hemorrhoids, with recommendation to repeat in 5 years.   Past Medical History:  Diagnosis Date   Anginal pain (HCC)    Corneal scarring    Coronary artery disease    Dyslipidemia    FHx: cardiovascular disease    GERD (gastroesophageal reflux disease)    History of hiatal hernia    History of kidney stones    "none since the 1990s" (01/27/2017)   Hypertension    Stroke [facility]) 2014   denies residual on 01/27/2017    Past Surgical History:  Procedure Laterality Date   CATARACT EXTRACTION Left    COLONOSCOPY  2004   CORONARY ANGIOPLASTY WITH STENT PLACEMENT  01/27/2017   CORONARY CTO INTERVENTION N/A 01/27/2017   Procedure: Coronary CTO Intervention;  Surgeon: Corky Crafts, MD;  Location: [facility] INVASIVE CV LAB;  Service: Cardiovascular;  Laterality: N/A;   CORONARY STENT INTERVENTION  12/15/2016    Successful PTCA and stenting of the distal circumflex with 3.0 x 20 mm Synergy, stenosis reduced from 90% to 0% with maintenance of TIMI-3 to TIMI-3 flow.   CORONARY STENT  INTERVENTION N/A 12/15/2016   Procedure: Coronary Stent Intervention;  Surgeon: Yates Decamp, MD;  Location: [facility] INVASIVE CV LAB;  Service: Cardiovascular;  Laterality: N/A;  LAD   CORONARY STENT INTERVENTION N/A 01/27/2017   Procedure: Coronary Stent Intervention;  Surgeon: Corky Crafts, MD;  Location: [facility] INVASIVE CV LAB;  Service: Cardiovascular;  Laterality: N/A;   ENDOVENOUS ABLATION SAPHENOUS VEIN W/ LASER Left 05/21/2022   endovenous laser ablation left greater saphenous vein and stab phlebectomy > 20 incisions left leg by Lemar Livings MD   ENDOVENOUS ABLATION SAPHENOUS VEIN W/ LASER Right 06/04/2022   endovenous laser ablation right greater saphenous vein and stab phlebectomy >20 incisions right leg by Lemar Livings MD   EYE TRANSPLANT Right    LEFT HEART CATH AND CORONARY ANGIOGRAPHY N/A 12/15/2016   Procedure: Left Heart Cath and Coronary Angiography;  Surgeon: Yates Decamp, MD;  Location: [facility] INVASIVE CV LAB;  Service: Cardiovascular;  Laterality: N/A;   LEFT HEART CATH AND CORONARY ANGIOGRAPHY N/A 01/27/2017   Procedure: Left Heart Cath and Coronary Angiography;  Surgeon: Corky Crafts, MD;  Location: [facility] INVASIVE CV LAB;  Service: Cardiovascular;  Laterality: N/A;    Prior to Admission medications   Medication Sig Start Date End Date Taking? Authorizing Provider  amLODipine (NORVASC) 5 MG tablet TAKE 1 TABLET BY MOUTH IN THE  EVENING AFTER DINNER 03/31/23   Yates Decamp, MD  aspirin 81 MG tablet Take 81 mg by mouth daily.    [provider]  Evolocumab (REPATHA SURECLICK)  140 MG/ML SOAJ INJECT INTO THE SKIN EVERY FOURTEEN DAYS 07/02/23   Yates Decamp, MD  ezetimibe (ZETIA) 10 MG tablet TAKE 1 TABLET BY MOUTH DAILY 02/17/23   Yates Decamp, MD  Multiple Vitamin (MULTI-VITAMIN) tablet Take 1 tablet by mouth daily.    [provider]  nitroGLYCERIN (NITROSTAT) 0.4 MG SL tablet Place 1 tablet (0.4 mg total) under the tongue every 5 (five) minutes as needed for chest  pain. Patient not taking: Reported on 09/02/2023 01/01/21   Yates Decamp, MD  pantoprazole (PROTONIX) 40 MG tablet Take 1 tablet (40 mg total) by mouth daily. 06/01/23   Ronnald Nian, MD  prednisoLONE acetate (PRED FORTE) 1 % ophthalmic suspension Place 1 drop into the right eye in the morning and at bedtime. 07/16/20   [provider]    Current Outpatient Medications  Medication Sig Dispense Refill   amLODipine (NORVASC) 5 MG tablet TAKE 1 TABLET BY MOUTH IN THE  EVENING AFTER DINNER 100 tablet 2   aspirin 81 MG tablet Take 81 mg by mouth daily.     Evolocumab (REPATHA SURECLICK) 140 MG/ML SOAJ INJECT INTO THE SKIN EVERY FOURTEEN DAYS 6 mL 3   ezetimibe (ZETIA) 10 MG tablet TAKE 1 TABLET BY MOUTH DAILY 100 tablet 2   Multiple Vitamin (MULTI-VITAMIN) tablet Take 1 tablet by mouth daily.     nitroGLYCERIN (NITROSTAT) 0.4 MG SL tablet Place 1 tablet (0.4 mg total) under the tongue every 5 (five) minutes as needed for chest pain. (Patient not taking: Reported on 09/02/2023) 25 tablet 12   pantoprazole (PROTONIX) 40 MG tablet Take 1 tablet (40 mg total) by mouth daily. 100 tablet 2   prednisoLONE acetate (PRED FORTE) 1 % ophthalmic suspension Place 1 drop into the right eye in the morning and at bedtime.     Current Facility-Administered Medications  Medication Dose Route Frequency Provider Last Rate Last Admin   0.9 %  sodium chloride infusion  500 mL Intravenous Once Shamarie Call V, DO        Allergies as of 09/02/2023 - Review Complete 09/02/2023  Allergen Reaction Noted   Statins Other (See Comments) 05/16/2018    Family History  Problem Relation Age of Onset   Other Mother        Natural Causes   Other Sister        Natural Causes   Colon cancer Neg Hx    Esophageal cancer Neg Hx    Rectal cancer Neg Hx    Stomach cancer Neg Hx    Colon polyps Neg Hx     Social History   Socioeconomic History   Marital status: Single    Spouse name: Not on file   Number  of children: 2   Years of education: Not on file   Highest education level: Doctorate  Occupational History   Not on file  Tobacco Use   Smoking status: Never    Passive exposure: Never   Smokeless tobacco: Never  Vaping Use   Vaping status: Never Used  Substance and Sexual Activity   Alcohol use: No   Drug use: No   Sexual activity: Yes  Other Topics Concern   Not on file  Social History Narrative   Not on file   Social Drivers of Health   Financial Resource Strain: Low Risk  (05/31/2023)   Overall Financial Resource Strain (CARDIA)    Difficulty of Paying Living Expenses: Not hard at all  Food Insecurity: No Food  Insecurity (06/01/2023)   Hunger Vital Sign    Worried About Running Out of Food in the Last Year: Never true    Ran Out of Food in the Last Year: Never true  Transportation Needs: No Transportation Needs (06/01/2023)   PRAPARE - Administrator, Civil Service (Medical): No    Lack of Transportation (Non-Medical): No  Physical Activity: Sufficiently Active (06/01/2023)   Exercise Vital Sign    Days of Exercise per Week: 7 days    Minutes of Exercise per Session: 150+ min  Stress: No Stress Concern Present (05/31/2023)   [employer] of Occupational Health - Occupational Stress Questionnaire    Feeling of Stress : Not at all  Social Connections: Moderately Integrated (05/31/2023)   Social Connection and Isolation Panel [NHANES]    Frequency of Communication with Friends and Family: More than three times a week    Frequency of Social Gatherings with Friends and Family: More than three times a week    Attends Religious Services: Never    Database administrator or Organizations: Yes    Attends Engineer, structural: More than 4 times per year    Marital Status: Married  Catering manager Violence: Not At Risk (06/01/2023)   Humiliation, Afraid, Rape, and Kick questionnaire    Fear of Current or Ex-Partner: No    Emotionally Abused: No     Physically Abused: No    Sexually Abused: No    Physical Exam: Vital signs in last 24 hours: @BP  (!) 156/73   Pulse (!) 46   Temp 98.8 F (37.1 C)   Resp 12   Ht 5' 7.5" (1.715 m)   Wt 165 lb (74.8 kg)   SpO2 98%   BMI 25.46 kg/m  GEN: NAD EYE: Sclerae anicteric ENT: MMM CV: Non-tachycardic Pulm: CTA b/l GI: Soft, NT/ND NEURO:  Alert & Oriented x 3   Doristine Locks, DO [facility]   09/02/2023 2:16 PM

## 2023-09-02 NOTE — Progress Notes (Signed)
Vss nad trans to pacu 

## 2023-09-02 NOTE — Progress Notes (Signed)
Pt's states no medical or surgical changes since previsit or office visit. 

## 2023-09-03 ENCOUNTER — Telehealth: Payer: Self-pay | Admitting: *Deleted

## 2023-09-03 NOTE — Telephone Encounter (Signed)
  Follow up Call-     09/02/2023    1:33 PM  Call back number  Post procedure Call Back phone  # [phone]  Permission to leave phone message Yes     Patient questions:  Do you have a fever, pain , or abdominal swelling? No. Pain Score  0 *  Have you tolerated food without any problems? Yes.    Have you been able to return to your normal activities? Yes.    Do you have any questions about your discharge instructions: Diet   No. Medications  No. Follow up visit  No.  Do you have questions or concerns about your Care? No.  Actions: * If pain score is 4 or above: No action needed, pain <4.

## 2023-09-07 LAB — SURGICAL PATHOLOGY

## 2023-09-08 ENCOUNTER — Encounter: Payer: Self-pay | Admitting: Gastroenterology

## 2023-10-06 ENCOUNTER — Encounter: Payer: Self-pay | Admitting: Internal Medicine

## 2023-10-07 ENCOUNTER — Other Ambulatory Visit: Payer: Self-pay | Admitting: Cardiology

## 2023-10-07 DIAGNOSIS — E7800 Pure hypercholesterolemia, unspecified: Secondary | ICD-10-CM

## 2023-10-12 ENCOUNTER — Encounter: Payer: Self-pay | Admitting: Internal Medicine

## 2023-10-15 ENCOUNTER — Other Ambulatory Visit: Payer: Self-pay | Admitting: Cardiology

## 2023-10-15 DIAGNOSIS — K219 Gastro-esophageal reflux disease without esophagitis: Secondary | ICD-10-CM

## 2023-11-25 DIAGNOSIS — H1789 Other corneal scars and opacities: Secondary | ICD-10-CM | POA: Diagnosis not present

## 2023-11-25 DIAGNOSIS — H524 Presbyopia: Secondary | ICD-10-CM | POA: Diagnosis not present

## 2023-11-25 DIAGNOSIS — Z961 Presence of intraocular lens: Secondary | ICD-10-CM | POA: Diagnosis not present

## 2023-12-06 ENCOUNTER — Other Ambulatory Visit: Payer: Self-pay | Admitting: Cardiology

## 2023-12-06 DIAGNOSIS — E7800 Pure hypercholesterolemia, unspecified: Secondary | ICD-10-CM

## 2023-12-09 ENCOUNTER — Other Ambulatory Visit: Payer: Self-pay | Admitting: Cardiology

## 2023-12-09 DIAGNOSIS — E7800 Pure hypercholesterolemia, unspecified: Secondary | ICD-10-CM

## 2023-12-09 DIAGNOSIS — K219 Gastro-esophageal reflux disease without esophagitis: Secondary | ICD-10-CM

## 2023-12-11 ENCOUNTER — Other Ambulatory Visit: Payer: Self-pay | Admitting: Cardiology

## 2023-12-11 DIAGNOSIS — I10 Essential (primary) hypertension: Secondary | ICD-10-CM

## 2023-12-13 ENCOUNTER — Other Ambulatory Visit: Payer: Self-pay | Admitting: Cardiology

## 2023-12-13 DIAGNOSIS — K219 Gastro-esophageal reflux disease without esophagitis: Secondary | ICD-10-CM

## 2024-01-18 ENCOUNTER — Other Ambulatory Visit (HOSPITAL_COMMUNITY): Payer: Medicare Other

## 2024-02-16 ENCOUNTER — Ambulatory Visit: Payer: Medicare Other | Admitting: Cardiology

## 2024-03-02 ENCOUNTER — Other Ambulatory Visit: Payer: Self-pay | Admitting: Family Medicine

## 2024-03-02 DIAGNOSIS — E785 Hyperlipidemia, unspecified: Secondary | ICD-10-CM

## 2024-03-02 DIAGNOSIS — I10 Essential (primary) hypertension: Secondary | ICD-10-CM

## 2024-03-15 ENCOUNTER — Other Ambulatory Visit

## 2024-03-15 DIAGNOSIS — E785 Hyperlipidemia, unspecified: Secondary | ICD-10-CM

## 2024-03-15 DIAGNOSIS — I10 Essential (primary) hypertension: Secondary | ICD-10-CM

## 2024-03-15 LAB — LIPID PANEL

## 2024-03-16 ENCOUNTER — Ambulatory Visit: Attending: Cardiology | Admitting: Cardiology

## 2024-03-16 ENCOUNTER — Encounter: Payer: Self-pay | Admitting: Cardiology

## 2024-03-16 ENCOUNTER — Telehealth: Payer: Self-pay

## 2024-03-16 ENCOUNTER — Other Ambulatory Visit (HOSPITAL_COMMUNITY): Payer: Self-pay

## 2024-03-16 ENCOUNTER — Other Ambulatory Visit: Payer: Self-pay | Admitting: *Deleted

## 2024-03-16 VITALS — BP 157/71 | HR 56 | Resp 16 | Ht 67.0 in | Wt 171.8 lb

## 2024-03-16 DIAGNOSIS — I25118 Atherosclerotic heart disease of native coronary artery with other forms of angina pectoris: Secondary | ICD-10-CM

## 2024-03-16 DIAGNOSIS — I10 Essential (primary) hypertension: Secondary | ICD-10-CM | POA: Diagnosis not present

## 2024-03-16 DIAGNOSIS — G720 Drug-induced myopathy: Secondary | ICD-10-CM | POA: Diagnosis not present

## 2024-03-16 DIAGNOSIS — I351 Nonrheumatic aortic (valve) insufficiency: Secondary | ICD-10-CM | POA: Diagnosis not present

## 2024-03-16 DIAGNOSIS — T466X5D Adverse effect of antihyperlipidemic and antiarteriosclerotic drugs, subsequent encounter: Secondary | ICD-10-CM

## 2024-03-16 LAB — CBC WITH DIFFERENTIAL/PLATELET
Basophils Absolute: 0 x10E3/uL (ref 0.0–0.2)
Basos: 1 %
EOS (ABSOLUTE): 0.1 x10E3/uL (ref 0.0–0.4)
Eos: 2 %
Hematocrit: 41.4 % (ref 37.5–51.0)
Hemoglobin: 13.3 g/dL (ref 13.0–17.7)
Immature Grans (Abs): 0 x10E3/uL (ref 0.0–0.1)
Immature Granulocytes: 0 %
Lymphocytes Absolute: 1.7 x10E3/uL (ref 0.7–3.1)
Lymphs: 26 %
MCH: 29.6 pg (ref 26.6–33.0)
MCHC: 32.1 g/dL (ref 31.5–35.7)
MCV: 92 fL (ref 79–97)
Monocytes Absolute: 0.6 x10E3/uL (ref 0.1–0.9)
Monocytes: 9 %
Neutrophils Absolute: 4.1 x10E3/uL (ref 1.4–7.0)
Neutrophils: 62 %
Platelets: 232 x10E3/uL (ref 150–450)
RBC: 4.49 x10E6/uL (ref 4.14–5.80)
RDW: 12.4 % (ref 11.6–15.4)
WBC: 6.6 x10E3/uL (ref 3.4–10.8)

## 2024-03-16 LAB — COMPREHENSIVE METABOLIC PANEL WITH GFR
ALT: 21 IU/L (ref 0–44)
AST: 21 IU/L (ref 0–40)
Albumin: 4.2 g/dL (ref 3.8–4.8)
Alkaline Phosphatase: 79 IU/L (ref 44–121)
BUN/Creatinine Ratio: 24 (ref 10–24)
BUN: 21 mg/dL (ref 8–27)
Bilirubin Total: 0.2 mg/dL (ref 0.0–1.2)
CO2: 22 mmol/L (ref 20–29)
Calcium: 9.1 mg/dL (ref 8.6–10.2)
Chloride: 104 mmol/L (ref 96–106)
Creatinine, Ser: 0.86 mg/dL (ref 0.76–1.27)
Globulin, Total: 2.6 g/dL (ref 1.5–4.5)
Glucose: 103 mg/dL — AB (ref 70–99)
Potassium: 4.9 mmol/L (ref 3.5–5.2)
Sodium: 138 mmol/L (ref 134–144)
Total Protein: 6.8 g/dL (ref 6.0–8.5)
eGFR: 90 mL/min/1.73 (ref >59)

## 2024-03-16 LAB — LIPID PANEL
Cholesterol, Total: 96 mg/dL — AB (ref 100–199)
HDL: 36 mg/dL — AB (ref >39)
LDL CALC COMMENT:: 2.7 ratio (ref 0.0–5.0)
LDL Chol Calc (NIH): 44 mg/dL (ref 0–99)
Triglycerides: 74 mg/dL (ref 0–149)
VLDL Cholesterol Cal: 16 mg/dL (ref 5–40)

## 2024-03-16 MED ORDER — LOSARTAN POTASSIUM-HCTZ 50-12.5 MG PO TABS
1.0000 | ORAL_TABLET | ORAL | 1 refills | Status: DC
  Filled 2024-03-16: qty 90, 90d supply, fill #0
  Filled 2024-06-09: qty 90, 90d supply, fill #1

## 2024-03-16 NOTE — Progress Notes (Signed)
 Cardiology Office Note:  .   Date:  03/16/2024  ID:  Jose Dyer, DOB 1950/09/14, MRN [id number] PCP: Joyce Norleen BROCKS, MD   [facility]Care Providers Cardiologist:  Gordy Bergamo, MD   History of Present Illness: Jose Dyer   Jose Dyer is a 73 y.o. male patient with history of stroke without residual defects 2015, CAD with CTO LAD S/P revascularization of the LAD on 01/27/2017 by retrograde access. He had undergone successful revascularization and stenting of the circumflex coronary artery on 12/14/2016 which is widely patent.  Also has bilateral GSV ablation.  Echocardiogram on 01/15/2022 revealing normal LVEF at 65% with mild diastolic dysfunction with moderate AI, mild to moderate MR and TR.  He is asymptomatic. He continues to remain very active and walks anywhere from 8 to 10 miles a day.   Discussed the use of AI scribe software for clinical note transcription with the patient, who gave verbal consent to proceed.  History of Present Illness Jose Dyer is a 73 year old male with coronary artery disease and hypertension who presents for a routine cardiovascular follow-up.  He has stable angina managed with aspirin  and amlodipine . His last echocardiogram was on January 15, 2022, with a follow-up scheduled next year. Hypertension is well controlled with amlodipine , though he experiences white coat hypertension during office visits. Home blood pressure readings are excellent.  He is on Repatha  due to statin myopathy, with lipid levels at goal and an LDL of 44. He experiences aches and pains similar to those with statins but manages these symptoms.  Moderate aortic regurgitation and mild to moderate mitral and tricuspid regurgitation are present, but he remains clinically stable and asymptomatic. His echocardiogram from 2023 was reviewed.  He maintains an active lifestyle, walking 18 holes of golf daily, exercising on a treadmill for at least an hour each night, and engaging in weight training. No chest  discomfort, shortness of breath, or changes in exercise tolerance. No symptoms suggestive of stent malfunction.  Labs   Lab Results  Component Value Date   CHOL 96 (L) 03/15/2024   HDL 36 (L) 03/15/2024   LDLCALC 44 03/15/2024   LDLDIRECT 53 12/30/2020   TRIG 74 03/15/2024   CHOLHDL 2.7 03/15/2024   Lipoprotein (a)  Date/Time Value Ref Range Status  01/07/2022 09:18 AM 63.0 <75.0 nmol/L Final    Comment:    Note:  Values greater than or equal to 75.0 nmol/L may        indicate an independent risk factor for CHD,        but must be evaluated with caution when applied        to non-Caucasian populations due to the        influence of genetic factors on Lp(a) across        ethnicities.     Lab Results  Component Value Date   NA 138 03/15/2024   K 4.9 03/15/2024   CO2 22 03/15/2024   GLUCOSE 103 (H) 03/15/2024   BUN 21 03/15/2024   CREATININE 0.86 03/15/2024   CALCIUM  9.1 03/15/2024   EGFR 90 03/15/2024   GFRNONAA >60 09/04/2018      Latest Ref Rng & Units 03/15/2024    8:45 AM 06/01/2023   11:10 AM 01/07/2022    9:18 AM  BMP  Glucose 70 - 99 mg/dL 896  96  897   BUN 8 - 27 mg/dL 21  18  22    Creatinine 0.76 - 1.27 mg/dL 9.13  9.11  9.10  BUN/Creat Ratio 10 - 24 24  20  25    Sodium 134 - 144 mmol/L 138  140  143   Potassium 3.5 - 5.2 mmol/L 4.9  5.1  4.4   Chloride 96 - 106 mmol/L 104  102  105   CO2 20 - 29 mmol/L 22  27  25    Calcium  8.6 - 10.2 mg/dL 9.1  9.4  9.1       Latest Ref Rng & Units 03/15/2024    8:45 AM 06/01/2023   11:10 AM 01/20/2023    7:22 AM  CBC  WBC 3.4 - 10.8 x10E3/uL 6.6  6.3  6.2   Hemoglobin 13.0 - 17.7 g/dL 86.6  85.9  86.3   Hematocrit 37.5 - 51.0 % 41.4  43.2  40.1   Platelets 150 - 450 x10E3/uL 232  214  189    Lab Results  Component Value Date   HGBA1C 5.4 01/07/2022    Lab Results  Component Value Date   TSH 0.505 01/20/2023    ROS  Review of Systems  Cardiovascular:  Negative for chest pain, dyspnea on exertion and leg  swelling.   Physical Exam:   VS:  BP (!) 157/71 (BP Location: Left Arm, Patient Position: Sitting, Cuff Size: Normal)   Pulse (!) 56   Resp 16   Ht 5' 7 (1.702 m)   Wt 171 lb 12.8 oz (77.9 kg)   SpO2 97%   BMI 26.91 kg/m    Wt Readings from Last 3 Encounters:  03/16/24 171 lb 12.8 oz (77.9 kg)  09/02/23 165 lb (74.8 kg)  08/05/23 165 lb (74.8 kg)    Physical Exam Neck:     Vascular: No carotid bruit or JVD.  Cardiovascular:     Rate and Rhythm: Regular rhythm. Bradycardia present.     Pulses: Intact distal pulses.     Heart sounds: Normal heart sounds. No murmur heard.    No gallop.  Pulmonary:     Effort: Pulmonary effort is normal.     Breath sounds: Normal breath sounds.  Abdominal:     General: Bowel sounds are normal.     Palpations: Abdomen is soft.  Musculoskeletal:     Right lower leg: No edema.     Left lower leg: No edema.    Studies Reviewed: Jose Dyer    Coronary angiogram by Dr. Dann 01/27/2017: CTO Prox LAD to Mid LAD lesion, 100 %stenosed, right to left collaterals.Successful CTO PCI with a SYNERGY DES 3.5X38 drug eluting stent which was successfully placed, postdilated to > 4 mm. Distal Cx 3.0x20 mm Synergy DES placed 12/14/2016 widely patent. Normal LVEF.    EKG:    EKG Interpretation Date/Time:  Thursday March 16 2024 09:30:06 EDT Ventricular Rate:  44 PR Interval:  170 QRS Duration:  90 QT Interval:  424 QTC Calculation: 362 R Axis:   -17  Text Interpretation: EKG 03/16/2024: Marked sinus bradycardia at rate of 44 bpm, poor R progression, otherwise normal EKG.  No significant change from prior EKG including EKG 01/27/2017. Confirmed by Tayten Bergdoll, Jagadeesh [phone]) on 03/16/2024 10:00:34 AM  EKG 01/18/2023: Marked sinus bradycardia at the rate of 48 bpm, early repolarization, otherwise normal EKG.   Medications ordered    Meds ordered this encounter  Medications   losartan -hydrochlorothiazide  (HYZAAR ) 50-12.5 MG tablet    Sig: Take 1 tablet by mouth  every morning.    Dispense:  90 tablet    Refill:  1     ASSESSMENT AND  PLAN: .      ICD-10-CM   1. Atherosclerosis of native coronary artery of native heart with stable angina pectoris (HCC)  I25.118 EKG 12-Lead    2. Moderate aortic regurgitation  I35.1     3. Primary hypertension  I10 losartan -hydrochlorothiazide  (HYZAAR ) 50-12.5 MG tablet    Basic Metabolic Panel (BMET)    4. White coat syndrome with diagnosis of hypertension  I10 Basic Metabolic Panel (BMET)    5. Statin myopathy  G72.0    T46.6X5A      Assessment & Plan Coronary artery disease with stable angina Coronary artery disease of native vessel with stable angina. Asymptomatic and well-managed on aspirin  and amlodipine  5 mg daily. Last echocardiogram on 01/15/2022 showed no new concerns. Stents have a lifelong warranty and are unlikely to close after one year. Signs of stent issues include chest discomfort or reduced exercise tolerance, which he does not experience. - Continue aspirin  and amlodipine  5 mg daily - Consider echocardiogram next year if symptoms develop  Hypertension including white coat hypertension Hypertension is well-controlled at home on amlodipine . Home blood pressure recordings are excellent, but he experiences white coat hypertension with elevated blood pressure readings in the clinic. Discussed the addition of losartan  to help manage white coat hypertension and potential benefits for vascular health. Losartan  is not a diuretic and is expected to have minimal side effects, primarily dizziness if blood pressure drops too low. - Prescribe losartan  25-50 mg daily - Continue amlodipine  5 mg daily - Monitor blood pressure at home - Order blood work in 3-4 weeks to assess response to losartan   Statin-associated myopathy Currently on Repatha  with LDL at goal of 44. Reports experiencing aches and pains similar to previous statin use, but symptoms are tolerable. Discussed the possibility of skipping one  dose if symptoms become severe, but advised against skipping more than one dose. - Continue Repatha  as prescribed - Consider skipping one dose if symptoms become severe, but do not skip more than one dose  Moderate aortic regurgitation with mild to moderate mitral and tricuspid regurgitation Moderate aortic regurgitation with mild to moderate mitral and tricuspid regurgitation. Clinically stable and asymptomatic. Last echocardiogram in 2023 reviewed and no new concerns noted. - Repeat echocardiogram in two years     Signed,  Gordy Bergamo, MD, [facility] 03/16/2024, 8:07 PM [facility] [address] [place], [place] [address] Phone: [phone]. Fax:  [phone]

## 2024-03-16 NOTE — Telephone Encounter (Signed)
 Copied from CRM [id number]. Topic: Clinical - Request for Lab/Test Order >> Mar 16, 2024  9:05 AM Wess RAMAN wrote: Reason for CRM: Patient states he is only interested in the PSA test and needs that for his cardiologist.   Callback #: [phone]

## 2024-03-16 NOTE — Patient Instructions (Signed)
 Medication Instructions:  Your physician has recommended you make the following change in your medication:  Start losartan  hydrochlorothiazide  50/12.5 mg by mouth daily   *If you need a refill on your cardiac medications before your next appointment, please call your pharmacy*  Lab Work: Have lab work done in 3-4 weeks.  BMP.  This is not fasting.  Can be done at any [facility] location If you have labs (blood work) drawn today and your tests are completely normal, you will receive your results only by: MyChart Message (if you have MyChart) OR A paper copy in the mail If you have any lab test that is abnormal or we need to change your treatment, we will call you to review the results.  Testing/Procedures: Your physician has requested that you have an echocardiogram in 2 years prior to appointment with Dr Ladona. Echocardiography is a painless test that uses sound waves to create images of your heart. It provides your doctor with information about the size and shape of your heart and how well your heart's chambers and valves are working. This procedure takes approximately one hour. There are no restrictions for this procedure. Please do NOT wear cologne, perfume, aftershave, or lotions (deodorant is allowed). Please arrive 15 minutes prior to your appointment time.  Please note: We ask at that you not bring children with you during ultrasound (echo/ vascular) testing. Due to room size and safety concerns, children are not allowed in the ultrasound rooms during exams. Our front office staff cannot provide observation of children in our lobby area while testing is being conducted. An adult accompanying a patient to their appointment will only be allowed in the ultrasound room at the discretion of the ultrasound technician under special circumstances. We apologize for any inconvenience.   Follow-Up: At [facility], you and your health needs are our priority.  As part of our continuing  mission to provide you with exceptional heart care, our providers are all part of one team.  This team includes your primary Cardiologist (physician) and Advanced Practice Providers or APPs (Physician Assistants and Nurse Practitioners) who all work together to provide you with the care you need, when you need it.  Your next appointment:   2 year(s)  Provider:   Gordy Ladona, MD    We recommend signing up for the patient portal called MyChart.  Sign up information is provided on this After Visit Summary.  MyChart is used to connect with patients for Virtual Visits (Telemedicine).  Patients are able to view lab/test results, encounter notes, upcoming appointments, etc.  Non-urgent messages can be sent to your provider as well.   To learn more about what you can do with MyChart, go to [email or web address].   Other Instructions

## 2024-03-17 ENCOUNTER — Ambulatory Visit: Payer: Self-pay | Admitting: Family Medicine

## 2024-03-17 LAB — PSA: Prostate Specific Ag, Serum: 3.3 ng/mL (ref 0.0–4.0)

## 2024-03-17 LAB — SPECIMEN STATUS REPORT

## 2024-03-31 ENCOUNTER — Other Ambulatory Visit: Payer: Self-pay | Admitting: Cardiology

## 2024-03-31 DIAGNOSIS — K219 Gastro-esophageal reflux disease without esophagitis: Secondary | ICD-10-CM

## 2024-03-31 DIAGNOSIS — E7800 Pure hypercholesterolemia, unspecified: Secondary | ICD-10-CM

## 2024-04-13 DIAGNOSIS — I10 Essential (primary) hypertension: Secondary | ICD-10-CM | POA: Diagnosis not present

## 2024-04-14 ENCOUNTER — Ambulatory Visit: Payer: Self-pay | Admitting: Cardiology

## 2024-04-14 LAB — BASIC METABOLIC PANEL WITH GFR
BUN/Creatinine Ratio: 28 — AB (ref 10–24)
BUN: 26 mg/dL (ref 8–27)
CO2: 23 mmol/L (ref 20–29)
Calcium: 9.4 mg/dL (ref 8.6–10.2)
Chloride: 103 mmol/L (ref 96–106)
Creatinine, Ser: 0.93 mg/dL (ref 0.76–1.27)
Glucose: 92 mg/dL (ref 70–99)
Potassium: 4.8 mmol/L (ref 3.5–5.2)
Sodium: 141 mmol/L (ref 134–144)
eGFR: 86 mL/min/1.73 (ref >59)

## 2024-06-22 ENCOUNTER — Encounter: Payer: Self-pay | Admitting: Family Medicine

## 2024-06-22 ENCOUNTER — Ambulatory Visit (INDEPENDENT_AMBULATORY_CARE_PROVIDER_SITE_OTHER): Payer: Medicare Other | Admitting: Family Medicine

## 2024-06-22 VITALS — BP 124/72 | HR 72 | Ht 68.0 in | Wt 171.2 lb

## 2024-06-22 DIAGNOSIS — E785 Hyperlipidemia, unspecified: Secondary | ICD-10-CM

## 2024-06-22 DIAGNOSIS — Z0000 Encounter for general adult medical examination without abnormal findings: Secondary | ICD-10-CM | POA: Diagnosis not present

## 2024-06-22 DIAGNOSIS — R3912 Poor urinary stream: Secondary | ICD-10-CM

## 2024-06-22 DIAGNOSIS — Z789 Other specified health status: Secondary | ICD-10-CM

## 2024-06-22 DIAGNOSIS — Z23 Encounter for immunization: Secondary | ICD-10-CM

## 2024-06-22 DIAGNOSIS — H35033 Hypertensive retinopathy, bilateral: Secondary | ICD-10-CM | POA: Diagnosis not present

## 2024-06-22 DIAGNOSIS — I25118 Atherosclerotic heart disease of native coronary artery with other forms of angina pectoris: Secondary | ICD-10-CM

## 2024-06-22 DIAGNOSIS — N401 Enlarged prostate with lower urinary tract symptoms: Secondary | ICD-10-CM

## 2024-06-22 DIAGNOSIS — I10 Essential (primary) hypertension: Secondary | ICD-10-CM

## 2024-06-22 MED ORDER — NITROGLYCERIN 0.4 MG SL SUBL: 0.4000 mg | SUBLINGUAL_TABLET | SUBLINGUAL | 12 refills | Status: AC | PRN

## 2024-06-22 NOTE — Progress Notes (Signed)
 Name: Jose Dyer   Date of Visit: 06/22/24   Date of last visit with me: Visit date not found   CHIEF COMPLAINT:  Chief Complaint  Patient presents with   Annual Exam    Cpe and awv.        HPI:  Discussed the use of AI scribe software for clinical note transcription with the patient, who gave verbal consent to proceed.  History of Present Illness Jose Dyer is a 73 year old male with benign prostatic hyperplasia who presents with urinary symptoms.  He has been experiencing a slow urinary stream over the past two weeks. The stream starts slow and remains slow throughout urination, occurring intermittently with periods of two to three days of slow stream followed by normal urination. Certain foods, particularly meats and fatty foods, seem to exacerbate the issue.  His prostate-specific antigen (PSA) levels have been stable around 3 for the past 20 years. A PSA test conducted approximately five months ago was consistent with previous results. He has not been on any medication for this condition and prefers to manage it without pharmacological intervention.  He recently had labs done with his cardiologist and is not currently in need of any medication refills. He mentions that he has never used nitroglycerin , which was previously prescribed.  He is active and plays golf regularly.     OBJECTIVE:       06/22/2024    2:19 PM  Depression screen PHQ 2/9  Decreased Interest 0  Down, Depressed, Hopeless 0  PHQ - 2 Score 0     BP Readings from Last 3 Encounters:  06/22/24 124/72  03/16/24 (!) 157/71  09/02/23 122/66    BP 124/72   Pulse 72   Ht 5' 8 (1.727 m)   Wt 171 lb 3.2 oz (77.7 kg)   SpO2 97%   BMI 26.03 kg/m    Physical Exam    Physical Exam  ASSESSMENT/PLAN:   Assessment & Plan Primary hypertension  Hyperlipidemia LDL goal <100  Statin intolerance  Hypertensive retinopathy of both eyes  Routine general medical examination at a health care  facility  Flu vaccine need  Need for COVID-19 vaccine  Encounter for Medicare annual wellness exam  Benign prostatic hyperplasia with weak urinary stream  Atherosclerosis of native coronary artery of native heart with stable angina pectoris  Annual physical exam    Assessment and Plan Assessment & Plan Adult Physical exam Visit Overall health stable with satisfactory labs from cardiologist. - Performed basic labs including electrolytes and cholesterol. - Administered flu shot and COVID vaccine. - Comprehensive annual physical exam completed today. Reviewed interval history, current medical issues, medications, allergies, and preventive care needs. Addressed all patient questions and concerns. Discussed lifestyle factors including diet, exercise, sleep, and stress management. Reviewed recommended age-appropriate screenings, labs, and vaccinations. Counseling provided on healthy habits and routine health maintenance. Follow-up as indicated based on findings and results.  Benign prostatic hyperplasia with lower urinary tract symptoms Intermittent slow urine stream likely due to BPH. PSA stable at 3.1. Prefers non-pharmacological management. Discussed relaxation techniques and dietary influences. - Advised relaxation techniques before urination. - Monitor urinary symptoms and report if worsens. - Consider referral to urologist if symptoms persist or worsen.  Atherosclerotic heart disease of native coronary artery with angina No recent nitroglycerin  use. - Refilled nitroglycerin  prescription.  Hyperlipidemia Cholesterol levels to be re-evaluated with upcoming labs. - Ordered Lipid labs.     Saba Neuman A. Vita MD [facility]

## 2024-06-22 NOTE — Progress Notes (Signed)
 Subjective:   Jose Dyer is a 73 y.o. male who presents for a [employer] Visit.  Allergies (verified) Statins   History: Past Medical History:  Diagnosis Date   Anginal pain    Corneal scarring    Coronary artery disease    Dyslipidemia    FHx: cardiovascular disease    GERD (gastroesophageal reflux disease)    History of hiatal hernia    History of kidney stones    none since the 1990s (01/27/2017)   Hypertension    Stroke [facility]) 2014   denies residual on 01/27/2017   Past Surgical History:  Procedure Laterality Date   CATARACT EXTRACTION Left    COLONOSCOPY  2004   CORONARY ANGIOPLASTY WITH STENT PLACEMENT  01/27/2017   CORONARY CTO INTERVENTION N/A 01/27/2017   Procedure: Coronary CTO Intervention;  Surgeon: Dann Candyce RAMAN, MD;  Location: [facility] INVASIVE CV LAB;  Service: Cardiovascular;  Laterality: N/A;   CORONARY STENT INTERVENTION  12/15/2016    Successful PTCA and stenting of the distal circumflex with 3.0 x 20 mm Synergy, stenosis reduced from 90% to 0% with maintenance of TIMI-3 to TIMI-3 flow.   CORONARY STENT INTERVENTION N/A 12/15/2016   Procedure: Coronary Stent Intervention;  Surgeon: Gordy Bergamo, MD;  Location: [facility] INVASIVE CV LAB;  Service: Cardiovascular;  Laterality: N/A;  LAD   CORONARY STENT INTERVENTION N/A 01/27/2017   Procedure: Coronary Stent Intervention;  Surgeon: Dann Candyce RAMAN, MD;  Location: [facility] INVASIVE CV LAB;  Service: Cardiovascular;  Laterality: N/A;   ENDOVENOUS ABLATION SAPHENOUS VEIN W/ LASER Left 05/21/2022   endovenous laser ablation left greater saphenous vein and stab phlebectomy > 20 incisions left leg by Penne Colorado MD   ENDOVENOUS ABLATION SAPHENOUS VEIN W/ LASER Right 06/04/2022   endovenous laser ablation right greater saphenous vein and stab phlebectomy >20 incisions right leg by Penne Colorado MD   EYE TRANSPLANT Right    LEFT HEART CATH AND CORONARY ANGIOGRAPHY N/A 12/15/2016   Procedure: Left Heart Cath  and Coronary Angiography;  Surgeon: Gordy Bergamo, MD;  Location: [facility] INVASIVE CV LAB;  Service: Cardiovascular;  Laterality: N/A;   LEFT HEART CATH AND CORONARY ANGIOGRAPHY N/A 01/27/2017   Procedure: Left Heart Cath and Coronary Angiography;  Surgeon: Dann Candyce RAMAN, MD;  Location: [facility] INVASIVE CV LAB;  Service: Cardiovascular;  Laterality: N/A;   Family History  Problem Relation Age of Onset   Other Mother        Natural Causes   Other Sister        Natural Causes   Colon cancer Neg Hx    Esophageal cancer Neg Hx    Rectal cancer Neg Hx    Stomach cancer Neg Hx    Colon polyps Neg Hx    Social History   Occupational History   Not on file  Tobacco Use   Smoking status: Never    Passive exposure: Never   Smokeless tobacco: Never  Vaping Use   Vaping status: Never Used  Substance and Sexual Activity   Alcohol use: No   Drug use: No   Sexual activity: Yes   Tobacco Counseling Counseling given: Not Answered  SDOH Screenings   Food Insecurity: No Food Insecurity (06/22/2024)  Housing: Low Risk  (06/22/2024)  Transportation Needs: No Transportation Needs (06/22/2024)  Utilities: Not At Risk (06/22/2024)  Depression (PHQ2-9): Low Risk  (06/22/2024)  Financial Resource Strain: Low Risk  (06/20/2024)  Physical Activity: Sufficiently Active (06/20/2024)  Social Connections: Moderately Isolated (06/22/2024)  Stress: No Stress Concern Present (06/20/2024)  Tobacco Use: Low Risk  (06/22/2024)  Health Literacy: Adequate Health Literacy (06/22/2024)   Depression Screen    06/22/2024    2:19 PM 06/01/2023   10:29 AM 05/15/2022    2:31 PM 05/13/2021    2:00 PM 02/05/2021    8:10 AM 12/16/2015    2:16 PM 12/21/2013   10:16 AM  PHQ 2/9 Scores  PHQ - 2 Score 0 0 0 0 0 0 0  PHQ- 9 Score   0          Data saved with a previous flowsheet row definition     Goals Addressed             This Visit's Progress    Patient Stated       Get better at golf.        Visit info / Clinical  Intake: Medicare Wellness Visit Type:: Subsequent Annual Wellness Visit Medicare Wellness Visit Mode:: In-person (required for WTM) Interpreter Needed?: No Pre-visit prep was completed: no AWV questionnaire completed by patient prior to visit?: no Living arrangements:: (!) lives alone Patient's Overall Health Status Rating: very good Typical amount of pain: none Does pain affect daily life?: no Are you currently prescribed opioids?: no  Dietary Habits and Nutritional Risks How many meals a day?: 2 Eats fruit and vegetables daily?: yes Most meals are obtained by: preparing own meals In the last 2 weeks, have you had any of the following?: -- (none) Diabetic:: no  Functional Status Activities of Daily Living (to include ambulation/medication): Independent Ambulation: Independent Medication Administration: Independent Home Management: Independent Manage your own finances?: yes Primary transportation is: driving Concerns about vision?: no *vision screening is required for WTM* Concerns about hearing?: no  Fall Screening Falls in the past year?: 0 Number of falls in past year: 0 Was there an injury with Fall?: 0 Fall Risk Category Calculator: 0 Patient Fall Risk Level: Low Fall Risk  Fall Risk Patient at Risk for Falls Due to: No Fall Risks Fall risk Follow up: Falls evaluation completed  Home and Transportation Safety: All rugs have non-skid backing?: yes All stairs or steps have railings?: yes Grab bars in the bathtub or shower?: (!) no Have non-skid surface in bathtub or shower?: yes Good home lighting?: yes Regular seat belt use?: yes  Cognitive Assessment Difficulty concentrating, remembering, or making decisions? : no Will 6CIT or Mini Cog be Completed: no 6CIT or Mini Cog Declined: patient alert, oriented, able to answer questions appropriately and recall recent events  Advance Directives (For Healthcare) Does Patient Have a Medical Advance Directive?:  Yes Does patient want to make changes to medical advance directive?: Yes (Inpatient - patient defers changing a medical advance directive at this time - Information given) Type of Advance Directive: Healthcare Power of [place]; Living will; Out of facility DNR (pink MOST or yellow form) Copy of Healthcare Power of Attorney in Chart?: No - copy requested Copy of Living Will in Chart?: No - copy requested Out of facility DNR (pink MOST or yellow form) in Chart? (Ambulatory ONLY): No - copy requested Pre-existing out of facility DNR order (yellow form or pink MOST form): Pink MOST form placed in chart (order not valid for inpatient use)  Reviewed/Updated  Reviewed/Updated: All        Objective:    Today's Vitals   06/22/24 1421  BP: 124/72  Pulse: 72  SpO2: 97%  Weight: 171 lb 3.2 oz (77.7 kg)  Height:  5' 8 (1.727 m)   Body mass index is 26.03 kg/m.  Current Medications (verified) Outpatient Encounter Medications as of 06/22/2024  Medication Sig   amLODipine  (NORVASC ) 5 MG tablet TAKE 1 TABLET BY MOUTH IN THE  EVENING AFTER DINNER   aspirin  81 MG tablet Take 81 mg by mouth daily.   Evolocumab  (REPATHA  SURECLICK) 140 MG/ML SOAJ INJECT INTO THE SKIN EVERY FOURTEEN DAYS (Patient taking differently: Take 140 mg by mouth every 30 (thirty) days.)   ezetimibe  (ZETIA ) 10 MG tablet TAKE 1 TABLET BY MOUTH DAILY   losartan -hydrochlorothiazide  (HYZAAR ) 50-12.5 MG tablet Take 1 tablet by mouth every morning.   Multiple Vitamin (MULTI-VITAMIN) tablet Take 1 tablet by mouth daily.   nitroGLYCERIN  (NITROSTAT ) 0.4 MG SL tablet Place 1 tablet (0.4 mg total) under the tongue every 5 (five) minutes as needed for chest pain.   pantoprazole  (PROTONIX ) 40 MG tablet TAKE 1 TABLET BY MOUTH DAILY   prednisoLONE acetate (PRED FORTE) 1 % ophthalmic suspension Place 1 drop into the right eye in the morning and at bedtime.   No facility-administered encounter medications on file as of 06/22/2024.    Hearing/Vision screen No results found. Immunizations and Health Maintenance Health Maintenance  Topic Date Due   DTaP/Tdap/Td (2 - Td or Tdap) 09/26/2018   Influenza Vaccine  03/17/2024   COVID-19 Vaccine (7 - 2025-26 season) 04/17/2024   Medicare Annual Wellness (AWV)  05/31/2024   Colonoscopy  09/01/2028   Pneumococcal Vaccine: 50+ Years  Completed   Hepatitis C Screening  Completed   Zoster Vaccines- Shingrix  Completed   Meningococcal B Vaccine  Aged Out        Assessment/Plan:  This is a routine wellness examination for Fawzi.  Patient Care Team: Joyce Norleen BROCKS, MD as PCP - General (Family Medicine) Ladona Heinz, MD as PCP - Cardiology (Cardiology)  I have personally reviewed and noted the following in the patient's chart:   Medical and social history Use of alcohol, tobacco or illicit drugs  Current medications and supplements including opioid prescriptions. Functional ability and status Nutritional status Physical activity Advanced directives List of other physicians Hospitalizations, surgeries, and ER visits in previous 12 months Vitals Screenings to include cognitive, depression, and falls Referrals and appointments  Orders Placed This Encounter  Procedures   Flu vaccine HIGH DOSE PF(Fluzone Trivalent)   Pfizer Comirnaty Covid-19 Vaccine 68yrs & older   In addition, I have reviewed and discussed with patient certain preventive protocols, quality metrics, and best practice recommendations. A written personalized care plan for preventive services as well as general preventive health recommendations were provided to patient.   Jaliana Medellin A Surina Storts, MD   06/22/2024   No follow-ups on file.  After Visit Summary: (In Person-Printed) AVS printed and given to the patient  Nurse Notes: none

## 2024-06-23 ENCOUNTER — Ambulatory Visit: Payer: Self-pay | Admitting: Family Medicine

## 2024-06-23 LAB — COMPREHENSIVE METABOLIC PANEL WITH GFR
ALT: 15 IU/L (ref 0–44)
AST: 22 IU/L (ref 0–40)
Albumin: 4.4 g/dL (ref 3.8–4.8)
Alkaline Phosphatase: 74 IU/L (ref 47–123)
BUN/Creatinine Ratio: 26 — ABNORMAL HIGH (ref 10–24)
BUN: 25 mg/dL (ref 8–27)
Bilirubin Total: 0.5 mg/dL (ref 0.0–1.2)
CO2: 22 mmol/L (ref 20–29)
Calcium: 9.4 mg/dL (ref 8.6–10.2)
Chloride: 103 mmol/L (ref 96–106)
Creatinine, Ser: 0.95 mg/dL (ref 0.76–1.27)
Globulin, Total: 2.6 g/dL (ref 1.5–4.5)
Glucose: 93 mg/dL (ref 70–99)
Potassium: 4.5 mmol/L (ref 3.5–5.2)
Sodium: 140 mmol/L (ref 134–144)
Total Protein: 7 g/dL (ref 6.0–8.5)
eGFR: 83 mL/min/1.73 (ref >59)

## 2024-06-23 LAB — PSA, TOTAL AND FREE
PSA, Free Pct: 28 %
PSA, Free: 0.98 ng/mL
Prostate Specific Ag, Serum: 3.5 ng/mL (ref 0.0–4.0)

## 2024-06-23 LAB — LIPID PANEL
Chol/HDL Ratio: 2.8 ratio (ref 0.0–5.0)
Cholesterol, Total: 110 mg/dL (ref 100–199)
HDL: 40 mg/dL (ref >39)
LDL Chol Calc (NIH): 52 mg/dL (ref 0–99)
Triglycerides: 90 mg/dL (ref 0–149)
VLDL Cholesterol Cal: 18 mg/dL (ref 5–40)

## 2024-07-03 MED ORDER — PREDNISOLONE ACETATE 1 % OP SUSP: 1.0000 [drp] | Freq: Two times a day (BID) | OPHTHALMIC | 2 refills | Status: DC

## 2024-07-10 ENCOUNTER — Other Ambulatory Visit: Payer: Self-pay | Admitting: Cardiology

## 2024-07-11 ENCOUNTER — Other Ambulatory Visit: Payer: Self-pay | Admitting: Family Medicine

## 2024-07-11 NOTE — Telephone Encounter (Signed)
 Copied from CRM [id number]. Topic: Clinical - Medication Refill >> Jul 11, 2024  2:20 PM Amber H wrote: Medication: prednisoLONE  acetate (PRED FORTE ) 1 % ophthalmic suspension  Has the patient contacted their pharmacy? Yes, [facility] however, he wanted it [facility] but [facility] stated instructions were no clear.  (Agent: If no, request that the patient contact the pharmacy for the refill. If patient does not wish to contact the pharmacy document the reason why and proceed with request.) (Agent: If yes, when and what did the pharmacy advise?)  This is the patient's preferred pharmacy:  [employer] - [facility] - [place], [place] - [address] [place] [address] Phone: [phone] Fax: [phone]  Is this the correct pharmacy for this prescription? Yes If no, delete pharmacy and type the correct one.   Has the prescription been filled recently? Yes, 3 months ago patient stated.   Is the patient out of the medication? No, running low and will be leaving country in a few weeks.   Has the patient been seen for an appointment in the last year OR does the patient have an upcoming appointment? Yes  Can we respond through MyChart? Yes  Agent: Please be advised that Rx refills may take up to 3 business days. We ask that you follow-up with your pharmacy.

## 2024-07-11 NOTE — Telephone Encounter (Signed)
 Is this okay to refill?

## 2024-07-13 ENCOUNTER — Other Ambulatory Visit: Payer: Self-pay | Admitting: Family Medicine

## 2024-07-17 NOTE — Telephone Encounter (Signed)
 Does the pt. Need to remain on this.

## 2024-07-20 MED ORDER — PREDNISOLONE ACETATE 1 % OP SUSP: 1.0000 [drp] | Freq: Two times a day (BID) | OPHTHALMIC | 2 refills | Status: AC

## 2024-09-05 ENCOUNTER — Other Ambulatory Visit: Payer: Self-pay | Admitting: Medical

## 2024-09-05 ENCOUNTER — Other Ambulatory Visit (HOSPITAL_COMMUNITY): Payer: Self-pay

## 2024-09-05 DIAGNOSIS — I10 Essential (primary) hypertension: Secondary | ICD-10-CM

## 2024-09-05 MED ORDER — LOSARTAN POTASSIUM-HCTZ 50-12.5 MG PO TABS
1.0000 | ORAL_TABLET | ORAL | 2 refills | Status: AC
  Filled 2024-09-05: qty 90, 90d supply, fill #0

## 2024-09-06 ENCOUNTER — Other Ambulatory Visit: Payer: Self-pay | Admitting: Cardiology

## 2024-09-06 ENCOUNTER — Other Ambulatory Visit: Payer: Self-pay

## 2024-09-06 DIAGNOSIS — I10 Essential (primary) hypertension: Secondary | ICD-10-CM

## 2025-03-14 ENCOUNTER — Other Ambulatory Visit

## 2025-06-28 ENCOUNTER — Encounter: Admitting: Family Medicine
